# Patient Record
Sex: Male | Born: 1935 | Race: White | Hispanic: No
Health system: Southern US, Community
[De-identification: ages and names within clinical notes are randomized; demographics above are authoritative.]

## PROBLEM LIST (undated history)

## (undated) DIAGNOSIS — N400 Enlarged prostate without lower urinary tract symptoms: Secondary | ICD-10-CM

## (undated) DIAGNOSIS — F0280 Dementia in other diseases classified elsewhere without behavioral disturbance: Secondary | ICD-10-CM

## (undated) HISTORY — PX: CHOLECYSTECTOMY: SHX55

## (undated) HISTORY — PX: TONSILLECTOMY: SUR1361

---

## 2008-08-29 ENCOUNTER — Encounter
Admission: RE | Admit: 2008-08-29 | Discharge: 2008-08-29 | Payer: Self-pay | Admitting: Physical Medicine and Rehabilitation

## 2010-12-30 IMAGING — CT CT L SPINE W/O CM
3 of 12 series · 10 of 33 positions shown, 11 images · non-contrast
Comparison: None

CT THORACIC SPINE

CLINICAL DATA: Back pain.  Evaluate disc protrusion and
compression fracture.

CT THORACIC AND LUMBAR SPINE WITHOUT CONTRAST
TECHNIQUE: Multidetector CT imaging of the thoracic and lumbar
spine was performed without contrast. Multiplanar CT image
reconstructions were also generated.

[Series 5: lspine · axial · 0.28mm/px · z∈[+1181,+1427]mm · 3 of 124 slices shown, 4 images]
[im 1/124  soft-tissue]
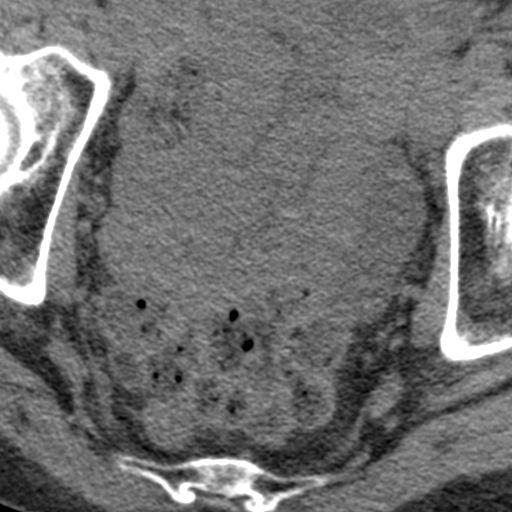
[im 1/124  bone]
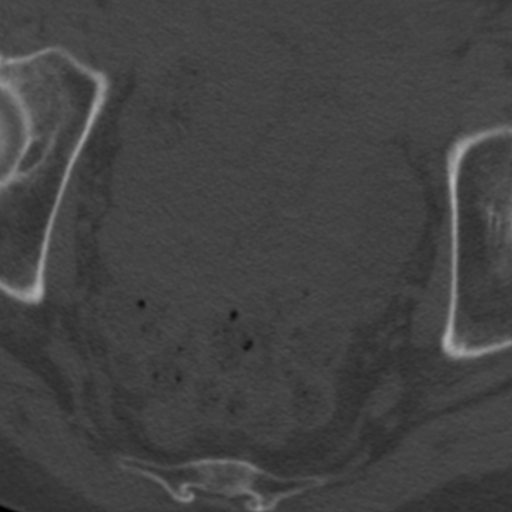
[im 62/124  bone]
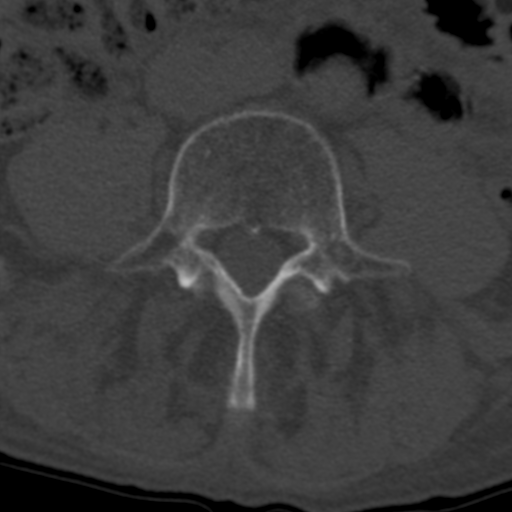
[im 124/124  bone]
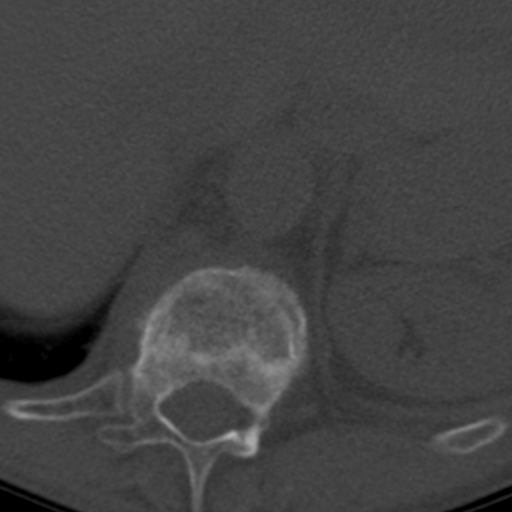

[coronals · coronal · 0.52mm/px · 2 of 92 slices shown]
[im 31/92  bone]
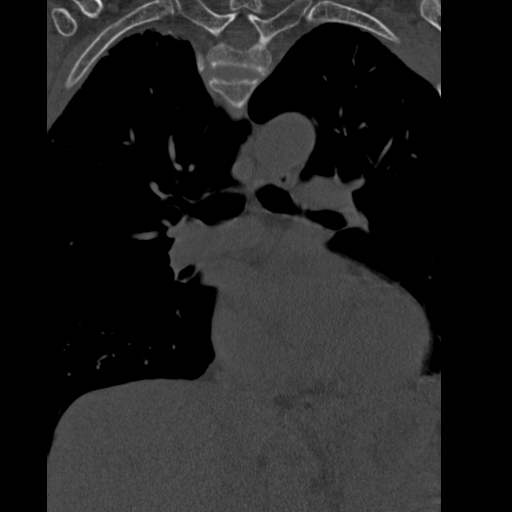
[im 61/92  bone]
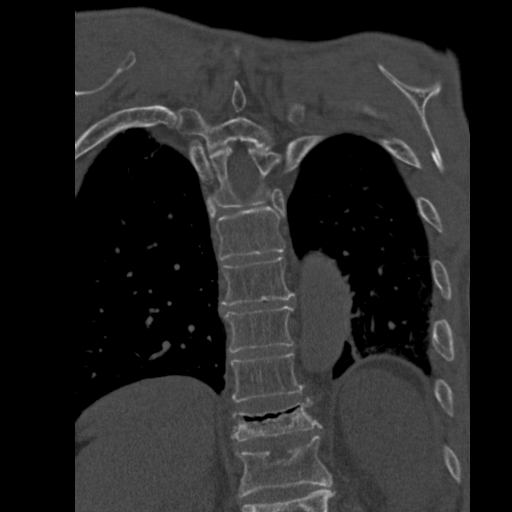

[sagittals · sagittal · 0.52mm/px · 5 of 89 slices shown]
[im 23/89  bone]
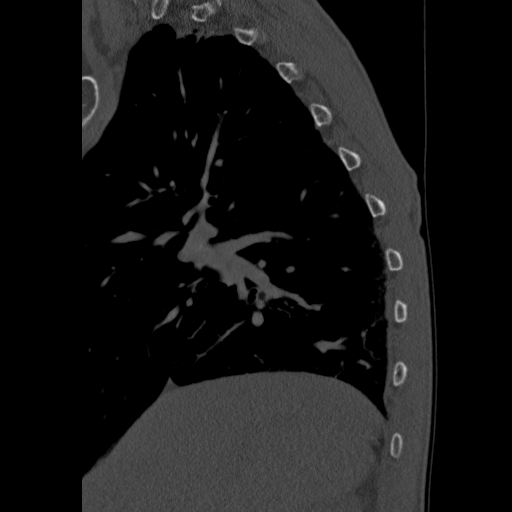
[im 34/89  bone]
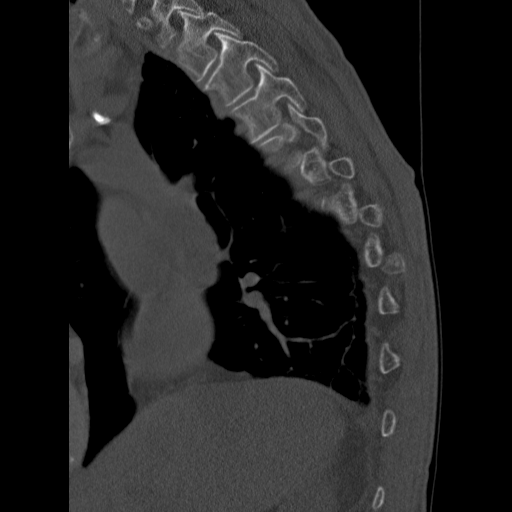
[im 45/89  bone]
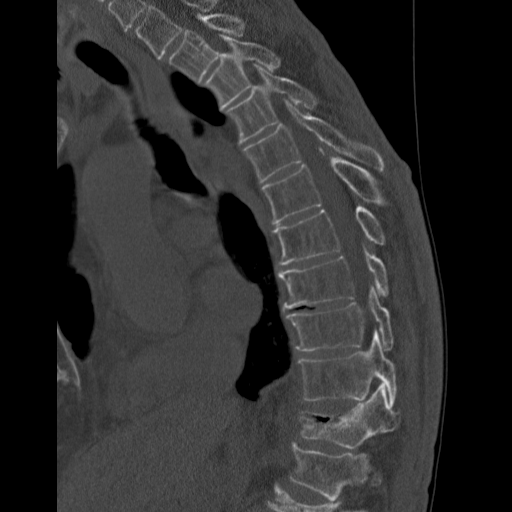
[im 56/89  bone]
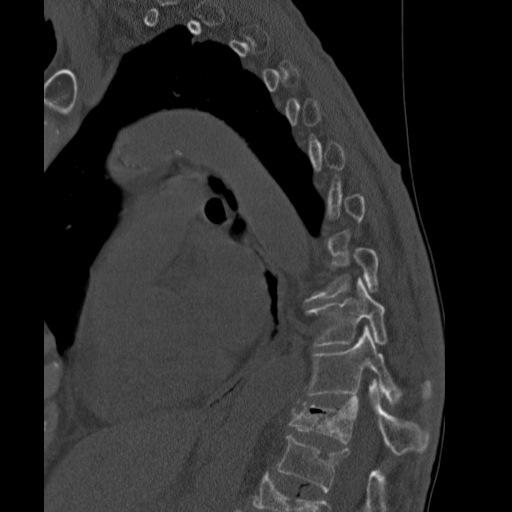
[im 67/89  bone]
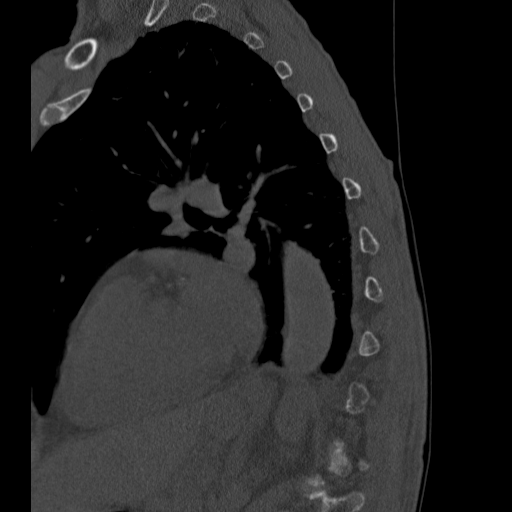

[10 of 33 positions shown; findings below may reference images not displayed]

FINDINGS: There is generalized osteopenia.  The thoracic alignment
is normal aside from a mild scoliosis.  A superior endplate
compression fracture at T11 results in 50% loss of vertebral body
height anteriorly.  There is vacuum phenomenon within the superior
endplate of T11 and the T10-T11 disc.  Mild paraspinal soft tissue
edema at that level suggests a probable acute or subacute age of
this fracture.  The posterior elements are intact.  There is no
significant epidural hematoma.

An approximately 25% superior endplate compression deformity at T12
appears simple and without evidence of associated paraspinal
hemorrhage.

No other thoracic spine fractures are identified.  The spinal canal
and neural foramina appear widely patent all levels.  No large disc
herniation is evident.

The visualized mediastinum is notable for coronary artery disease.
There is right apical pulmonary scarring.  No suspicious pulmonary
findings are present.
IMPRESSION: 1.  Approximately 50% superior endplate compression deformity at
T11.  There is associated paraspinal soft tissue stranding
suggesting an acute or subacute age.  Correlate clinically.
2.  Approximately 25% superior endplate compression deformity at
T12, probably chronic.
3.  No large disc herniation, spinal stenosis or nerve root
encroachment demonstrated.

CT LUMBAR SPINE
FINDINGS: There are biconcave compression fractures at L1 and L2.
Both fractures result in approximately 75% loss of vertebral body
height centrally.  Both fractures are associated with approximately
3 mm of osseous retropulsion of the superior endplate into the
spinal canal.  There are probable associated traumatic Schmorl's
nodes at L2.  No paraspinal or epidural hematoma is seen associated
with either of these fractures.  The L3 and L4 vertebral bodies are
intact.  There is a 50% biconcave compression fracture at L5
without osseous retropulsion or associated soft tissue swelling.  A
unilateral pars defect is noted on the left at L5.

T12-L1:  The disc bulging and osseous retropulsion contribute to
mild central stenosis.  There is no foraminal compromise.

L1-L2:  No significant disc space findings.

L2-L3:  Mild disc bulge and mild bilateral facet hypertrophy.  No
spinal stenosis or nerve root encroachment.

L3-L4:  Disc bulging and bilateral facet hypertrophy contribute to
mild triangulation of the thecal sac.  The foramina are
sufficiently patent.

L4-L5:  Disc bulging is mildly eccentric to the right and
contributes to mild central and mild right lateral recess stenosis.
Both foramina demonstrate mild inferior narrowing without definite
L4 nerve root encroachment.

L5-S1:  There is a broad-based central disc protrusion with
bilateral facet hypertrophy and a pars defect on the left.  Mild
neural foraminal narrowing is present bilaterally.
IMPRESSION: 1.  Age indeterminate compression fractures at L1, L2 and L5.  No
associated paraspinal hemorrhage or definite acute features are
demonstrated by CT.  MRI is more accurate for aging of spinal
compression fractures.
2.  The fractures at L1 and L2 are associated with mild
retropulsion of bone into the spinal canal, but no high-grade
spinal stenosis.
3.  Relatively mild spondylosis, most advanced at L4-L5 and L5-S1.
There is no high-grade spinal stenosis or nerve root compression.
Unilateral pars defect is noted on the left at L5.

## 2015-01-06 DIAGNOSIS — Z23 Encounter for immunization: Secondary | ICD-10-CM | POA: Diagnosis not present

## 2015-03-12 DIAGNOSIS — R69 Illness, unspecified: Secondary | ICD-10-CM | POA: Diagnosis not present

## 2015-06-22 DIAGNOSIS — S40912A Unspecified superficial injury of left shoulder, initial encounter: Secondary | ICD-10-CM | POA: Diagnosis not present

## 2015-06-30 DIAGNOSIS — S40912A Unspecified superficial injury of left shoulder, initial encounter: Secondary | ICD-10-CM | POA: Diagnosis not present

## 2015-07-28 DIAGNOSIS — H25813 Combined forms of age-related cataract, bilateral: Secondary | ICD-10-CM | POA: Diagnosis not present

## 2015-07-28 DIAGNOSIS — H401131 Primary open-angle glaucoma, bilateral, mild stage: Secondary | ICD-10-CM | POA: Diagnosis not present

## 2015-07-28 DIAGNOSIS — H4052X3 Glaucoma secondary to other eye disorders, left eye, severe stage: Secondary | ICD-10-CM | POA: Diagnosis not present

## 2015-08-03 DIAGNOSIS — Z23 Encounter for immunization: Secondary | ICD-10-CM | POA: Diagnosis not present

## 2015-09-10 DIAGNOSIS — R69 Illness, unspecified: Secondary | ICD-10-CM | POA: Diagnosis not present

## 2015-09-21 DIAGNOSIS — R52 Pain, unspecified: Secondary | ICD-10-CM | POA: Diagnosis not present

## 2015-09-21 DIAGNOSIS — S93601A Unspecified sprain of right foot, initial encounter: Secondary | ICD-10-CM | POA: Diagnosis not present

## 2015-12-11 DIAGNOSIS — I10 Essential (primary) hypertension: Secondary | ICD-10-CM | POA: Diagnosis not present

## 2015-12-11 DIAGNOSIS — Z0000 Encounter for general adult medical examination without abnormal findings: Secondary | ICD-10-CM | POA: Diagnosis not present

## 2015-12-11 DIAGNOSIS — Z125 Encounter for screening for malignant neoplasm of prostate: Secondary | ICD-10-CM | POA: Diagnosis not present

## 2015-12-11 DIAGNOSIS — E785 Hyperlipidemia, unspecified: Secondary | ICD-10-CM | POA: Diagnosis not present

## 2015-12-11 DIAGNOSIS — Z1389 Encounter for screening for other disorder: Secondary | ICD-10-CM | POA: Diagnosis not present

## 2016-01-05 DIAGNOSIS — R69 Illness, unspecified: Secondary | ICD-10-CM | POA: Diagnosis not present

## 2016-01-16 DIAGNOSIS — R6889 Other general symptoms and signs: Secondary | ICD-10-CM | POA: Diagnosis not present

## 2016-01-16 DIAGNOSIS — R4182 Altered mental status, unspecified: Secondary | ICD-10-CM | POA: Diagnosis not present

## 2016-01-16 DIAGNOSIS — I10 Essential (primary) hypertension: Secondary | ICD-10-CM | POA: Diagnosis not present

## 2016-01-16 DIAGNOSIS — E559 Vitamin D deficiency, unspecified: Secondary | ICD-10-CM | POA: Diagnosis not present

## 2016-01-16 DIAGNOSIS — R55 Syncope and collapse: Secondary | ICD-10-CM | POA: Diagnosis not present

## 2016-01-16 DIAGNOSIS — R69 Illness, unspecified: Secondary | ICD-10-CM | POA: Diagnosis not present

## 2016-01-16 DIAGNOSIS — R0602 Shortness of breath: Secondary | ICD-10-CM | POA: Diagnosis not present

## 2016-01-16 DIAGNOSIS — I680 Cerebral amyloid angiopathy: Secondary | ICD-10-CM | POA: Diagnosis not present

## 2016-01-16 DIAGNOSIS — H409 Unspecified glaucoma: Secondary | ICD-10-CM

## 2016-01-16 DIAGNOSIS — D649 Anemia, unspecified: Secondary | ICD-10-CM | POA: Diagnosis not present

## 2016-01-16 DIAGNOSIS — R001 Bradycardia, unspecified: Secondary | ICD-10-CM | POA: Diagnosis not present

## 2016-01-16 DIAGNOSIS — R5381 Other malaise: Secondary | ICD-10-CM

## 2016-01-16 DIAGNOSIS — S0990XA Unspecified injury of head, initial encounter: Secondary | ICD-10-CM | POA: Diagnosis not present

## 2016-01-16 DIAGNOSIS — Z79899 Other long term (current) drug therapy: Secondary | ICD-10-CM | POA: Diagnosis not present

## 2016-01-16 DIAGNOSIS — F0390 Unspecified dementia without behavioral disturbance: Secondary | ICD-10-CM | POA: Diagnosis not present

## 2016-01-16 DIAGNOSIS — E854 Organ-limited amyloidosis: Secondary | ICD-10-CM | POA: Diagnosis not present

## 2016-01-16 DIAGNOSIS — E860 Dehydration: Secondary | ICD-10-CM | POA: Diagnosis not present

## 2016-01-16 DIAGNOSIS — D181 Lymphangioma, any site: Secondary | ICD-10-CM | POA: Diagnosis not present

## 2016-01-17 DIAGNOSIS — D649 Anemia, unspecified: Secondary | ICD-10-CM | POA: Diagnosis not present

## 2016-01-17 DIAGNOSIS — R55 Syncope and collapse: Secondary | ICD-10-CM | POA: Diagnosis not present

## 2016-01-17 DIAGNOSIS — R69 Illness, unspecified: Secondary | ICD-10-CM | POA: Diagnosis not present

## 2016-01-17 DIAGNOSIS — I10 Essential (primary) hypertension: Secondary | ICD-10-CM | POA: Diagnosis not present

## 2016-01-17 DIAGNOSIS — E854 Organ-limited amyloidosis: Secondary | ICD-10-CM | POA: Diagnosis not present

## 2016-01-17 DIAGNOSIS — F0390 Unspecified dementia without behavioral disturbance: Secondary | ICD-10-CM | POA: Diagnosis not present

## 2016-01-18 DIAGNOSIS — E854 Organ-limited amyloidosis: Secondary | ICD-10-CM | POA: Diagnosis not present

## 2016-01-18 DIAGNOSIS — R55 Syncope and collapse: Secondary | ICD-10-CM | POA: Diagnosis not present

## 2016-01-18 DIAGNOSIS — R69 Illness, unspecified: Secondary | ICD-10-CM | POA: Diagnosis not present

## 2016-01-18 DIAGNOSIS — I10 Essential (primary) hypertension: Secondary | ICD-10-CM | POA: Diagnosis not present

## 2016-01-18 DIAGNOSIS — D649 Anemia, unspecified: Secondary | ICD-10-CM | POA: Diagnosis not present

## 2016-01-18 DIAGNOSIS — F0390 Unspecified dementia without behavioral disturbance: Secondary | ICD-10-CM | POA: Diagnosis not present

## 2016-01-20 DIAGNOSIS — I10 Essential (primary) hypertension: Secondary | ICD-10-CM | POA: Diagnosis not present

## 2016-01-20 DIAGNOSIS — H4052X3 Glaucoma secondary to other eye disorders, left eye, severe stage: Secondary | ICD-10-CM | POA: Diagnosis not present

## 2016-01-20 DIAGNOSIS — R4189 Other symptoms and signs involving cognitive functions and awareness: Secondary | ICD-10-CM | POA: Diagnosis not present

## 2016-01-20 DIAGNOSIS — R269 Unspecified abnormalities of gait and mobility: Secondary | ICD-10-CM | POA: Diagnosis not present

## 2016-01-25 DIAGNOSIS — R4189 Other symptoms and signs involving cognitive functions and awareness: Secondary | ICD-10-CM | POA: Diagnosis not present

## 2016-01-25 DIAGNOSIS — I680 Cerebral amyloid angiopathy: Secondary | ICD-10-CM | POA: Diagnosis not present

## 2016-01-25 DIAGNOSIS — R269 Unspecified abnormalities of gait and mobility: Secondary | ICD-10-CM | POA: Diagnosis not present

## 2016-02-24 DIAGNOSIS — R55 Syncope and collapse: Secondary | ICD-10-CM | POA: Diagnosis not present

## 2016-02-24 DIAGNOSIS — I680 Cerebral amyloid angiopathy: Secondary | ICD-10-CM | POA: Diagnosis not present

## 2016-02-29 DIAGNOSIS — J40 Bronchitis, not specified as acute or chronic: Secondary | ICD-10-CM | POA: Diagnosis not present

## 2016-03-18 DIAGNOSIS — R69 Illness, unspecified: Secondary | ICD-10-CM | POA: Diagnosis not present

## 2016-04-06 DIAGNOSIS — J189 Pneumonia, unspecified organism: Secondary | ICD-10-CM | POA: Diagnosis not present

## 2016-04-08 DIAGNOSIS — S0990XA Unspecified injury of head, initial encounter: Secondary | ICD-10-CM | POA: Diagnosis not present

## 2016-04-08 DIAGNOSIS — R531 Weakness: Secondary | ICD-10-CM | POA: Diagnosis not present

## 2016-04-08 DIAGNOSIS — R079 Chest pain, unspecified: Secondary | ICD-10-CM | POA: Diagnosis not present

## 2016-04-08 DIAGNOSIS — I6789 Other cerebrovascular disease: Secondary | ICD-10-CM | POA: Diagnosis not present

## 2016-04-08 DIAGNOSIS — R55 Syncope and collapse: Secondary | ICD-10-CM | POA: Diagnosis not present

## 2016-04-13 DIAGNOSIS — J189 Pneumonia, unspecified organism: Secondary | ICD-10-CM | POA: Diagnosis not present

## 2016-04-15 DIAGNOSIS — N390 Urinary tract infection, site not specified: Secondary | ICD-10-CM | POA: Diagnosis not present

## 2016-04-15 DIAGNOSIS — R3121 Asymptomatic microscopic hematuria: Secondary | ICD-10-CM | POA: Diagnosis not present

## 2016-04-15 DIAGNOSIS — R350 Frequency of micturition: Secondary | ICD-10-CM | POA: Diagnosis not present

## 2016-04-19 DIAGNOSIS — Z79899 Other long term (current) drug therapy: Secondary | ICD-10-CM | POA: Diagnosis not present

## 2016-04-19 DIAGNOSIS — R339 Retention of urine, unspecified: Secondary | ICD-10-CM | POA: Diagnosis not present

## 2016-04-19 DIAGNOSIS — R0602 Shortness of breath: Secondary | ICD-10-CM | POA: Diagnosis not present

## 2016-04-19 DIAGNOSIS — M25473 Effusion, unspecified ankle: Secondary | ICD-10-CM | POA: Diagnosis not present

## 2016-04-19 DIAGNOSIS — R55 Syncope and collapse: Secondary | ICD-10-CM | POA: Diagnosis not present

## 2016-04-22 DIAGNOSIS — R7989 Other specified abnormal findings of blood chemistry: Secondary | ICD-10-CM | POA: Diagnosis not present

## 2016-04-22 DIAGNOSIS — I82403 Acute embolism and thrombosis of unspecified deep veins of lower extremity, bilateral: Secondary | ICD-10-CM | POA: Diagnosis not present

## 2016-04-22 DIAGNOSIS — M7989 Other specified soft tissue disorders: Secondary | ICD-10-CM | POA: Diagnosis not present

## 2016-04-25 DIAGNOSIS — N401 Enlarged prostate with lower urinary tract symptoms: Secondary | ICD-10-CM | POA: Diagnosis not present

## 2016-04-25 DIAGNOSIS — R339 Retention of urine, unspecified: Secondary | ICD-10-CM | POA: Diagnosis not present

## 2016-05-03 DIAGNOSIS — R55 Syncope and collapse: Secondary | ICD-10-CM | POA: Diagnosis not present

## 2016-05-03 DIAGNOSIS — M7989 Other specified soft tissue disorders: Secondary | ICD-10-CM | POA: Diagnosis not present

## 2016-05-03 DIAGNOSIS — I10 Essential (primary) hypertension: Secondary | ICD-10-CM | POA: Diagnosis not present

## 2016-05-03 DIAGNOSIS — R7989 Other specified abnormal findings of blood chemistry: Secondary | ICD-10-CM | POA: Diagnosis not present

## 2016-05-03 DIAGNOSIS — N401 Enlarged prostate with lower urinary tract symptoms: Secondary | ICD-10-CM | POA: Diagnosis not present

## 2016-05-03 DIAGNOSIS — R3121 Asymptomatic microscopic hematuria: Secondary | ICD-10-CM | POA: Diagnosis not present

## 2016-05-09 DIAGNOSIS — M549 Dorsalgia, unspecified: Secondary | ICD-10-CM | POA: Diagnosis not present

## 2016-05-10 DIAGNOSIS — R55 Syncope and collapse: Secondary | ICD-10-CM | POA: Diagnosis not present

## 2016-05-11 DIAGNOSIS — N401 Enlarged prostate with lower urinary tract symptoms: Secondary | ICD-10-CM | POA: Diagnosis not present

## 2016-05-11 DIAGNOSIS — R339 Retention of urine, unspecified: Secondary | ICD-10-CM | POA: Diagnosis not present

## 2016-05-11 DIAGNOSIS — R55 Syncope and collapse: Secondary | ICD-10-CM | POA: Diagnosis not present

## 2016-05-16 DIAGNOSIS — R55 Syncope and collapse: Secondary | ICD-10-CM | POA: Diagnosis not present

## 2016-05-16 DIAGNOSIS — I10 Essential (primary) hypertension: Secondary | ICD-10-CM | POA: Diagnosis not present

## 2016-05-16 DIAGNOSIS — N400 Enlarged prostate without lower urinary tract symptoms: Secondary | ICD-10-CM | POA: Diagnosis not present

## 2016-05-20 DIAGNOSIS — I358 Other nonrheumatic aortic valve disorders: Secondary | ICD-10-CM | POA: Diagnosis not present

## 2016-05-20 DIAGNOSIS — R55 Syncope and collapse: Secondary | ICD-10-CM | POA: Diagnosis not present

## 2016-05-20 DIAGNOSIS — I10 Essential (primary) hypertension: Secondary | ICD-10-CM | POA: Diagnosis not present

## 2016-05-20 DIAGNOSIS — M7989 Other specified soft tissue disorders: Secondary | ICD-10-CM | POA: Diagnosis not present

## 2016-06-14 DIAGNOSIS — M7989 Other specified soft tissue disorders: Secondary | ICD-10-CM | POA: Diagnosis not present

## 2016-06-14 DIAGNOSIS — R4189 Other symptoms and signs involving cognitive functions and awareness: Secondary | ICD-10-CM | POA: Diagnosis not present

## 2016-06-14 DIAGNOSIS — I10 Essential (primary) hypertension: Secondary | ICD-10-CM | POA: Diagnosis not present

## 2016-06-14 DIAGNOSIS — N401 Enlarged prostate with lower urinary tract symptoms: Secondary | ICD-10-CM | POA: Diagnosis not present

## 2016-06-14 DIAGNOSIS — I680 Cerebral amyloid angiopathy: Secondary | ICD-10-CM | POA: Diagnosis not present

## 2016-06-28 DIAGNOSIS — R55 Syncope and collapse: Secondary | ICD-10-CM | POA: Diagnosis not present

## 2016-06-28 DIAGNOSIS — I10 Essential (primary) hypertension: Secondary | ICD-10-CM | POA: Diagnosis not present

## 2016-07-06 DIAGNOSIS — R339 Retention of urine, unspecified: Secondary | ICD-10-CM | POA: Diagnosis not present

## 2016-07-06 DIAGNOSIS — N401 Enlarged prostate with lower urinary tract symptoms: Secondary | ICD-10-CM | POA: Diagnosis not present

## 2016-08-04 DIAGNOSIS — H401131 Primary open-angle glaucoma, bilateral, mild stage: Secondary | ICD-10-CM | POA: Diagnosis not present

## 2016-08-04 DIAGNOSIS — H4052X3 Glaucoma secondary to other eye disorders, left eye, severe stage: Secondary | ICD-10-CM | POA: Diagnosis not present

## 2016-09-15 DIAGNOSIS — D649 Anemia, unspecified: Secondary | ICD-10-CM | POA: Diagnosis not present

## 2016-09-15 DIAGNOSIS — I10 Essential (primary) hypertension: Secondary | ICD-10-CM | POA: Diagnosis not present

## 2016-09-15 DIAGNOSIS — R4189 Other symptoms and signs involving cognitive functions and awareness: Secondary | ICD-10-CM | POA: Diagnosis not present

## 2016-09-15 DIAGNOSIS — Z79899 Other long term (current) drug therapy: Secondary | ICD-10-CM | POA: Diagnosis not present

## 2016-09-15 DIAGNOSIS — R05 Cough: Secondary | ICD-10-CM | POA: Diagnosis not present

## 2016-10-11 DIAGNOSIS — R339 Retention of urine, unspecified: Secondary | ICD-10-CM | POA: Diagnosis not present

## 2016-10-11 DIAGNOSIS — N401 Enlarged prostate with lower urinary tract symptoms: Secondary | ICD-10-CM | POA: Diagnosis not present

## 2016-12-06 DIAGNOSIS — R69 Illness, unspecified: Secondary | ICD-10-CM | POA: Diagnosis not present

## 2016-12-13 DIAGNOSIS — Z79899 Other long term (current) drug therapy: Secondary | ICD-10-CM | POA: Diagnosis not present

## 2016-12-13 DIAGNOSIS — E785 Hyperlipidemia, unspecified: Secondary | ICD-10-CM | POA: Diagnosis not present

## 2016-12-13 DIAGNOSIS — Z0001 Encounter for general adult medical examination with abnormal findings: Secondary | ICD-10-CM | POA: Diagnosis not present

## 2016-12-13 DIAGNOSIS — Z1389 Encounter for screening for other disorder: Secondary | ICD-10-CM | POA: Diagnosis not present

## 2017-02-01 DIAGNOSIS — R05 Cough: Secondary | ICD-10-CM | POA: Diagnosis not present

## 2017-02-21 DIAGNOSIS — R339 Retention of urine, unspecified: Secondary | ICD-10-CM | POA: Diagnosis not present

## 2017-02-21 DIAGNOSIS — N401 Enlarged prostate with lower urinary tract symptoms: Secondary | ICD-10-CM | POA: Diagnosis not present

## 2017-04-11 DIAGNOSIS — I10 Essential (primary) hypertension: Secondary | ICD-10-CM | POA: Diagnosis not present

## 2017-04-11 DIAGNOSIS — I680 Cerebral amyloid angiopathy: Secondary | ICD-10-CM | POA: Diagnosis not present

## 2017-04-11 DIAGNOSIS — R1310 Dysphagia, unspecified: Secondary | ICD-10-CM | POA: Diagnosis not present

## 2017-04-11 DIAGNOSIS — R4189 Other symptoms and signs involving cognitive functions and awareness: Secondary | ICD-10-CM | POA: Diagnosis not present

## 2017-05-12 DIAGNOSIS — Z79899 Other long term (current) drug therapy: Secondary | ICD-10-CM | POA: Diagnosis not present

## 2017-05-12 DIAGNOSIS — R413 Other amnesia: Secondary | ICD-10-CM | POA: Diagnosis not present

## 2017-05-12 DIAGNOSIS — R410 Disorientation, unspecified: Secondary | ICD-10-CM | POA: Diagnosis not present

## 2017-05-12 DIAGNOSIS — R4189 Other symptoms and signs involving cognitive functions and awareness: Secondary | ICD-10-CM | POA: Diagnosis not present

## 2017-05-12 DIAGNOSIS — G319 Degenerative disease of nervous system, unspecified: Secondary | ICD-10-CM | POA: Diagnosis not present

## 2017-05-12 DIAGNOSIS — I680 Cerebral amyloid angiopathy: Secondary | ICD-10-CM | POA: Diagnosis not present

## 2017-06-13 DIAGNOSIS — R69 Illness, unspecified: Secondary | ICD-10-CM | POA: Diagnosis not present

## 2017-08-07 DIAGNOSIS — H25813 Combined forms of age-related cataract, bilateral: Secondary | ICD-10-CM | POA: Diagnosis not present

## 2017-08-07 DIAGNOSIS — H401112 Primary open-angle glaucoma, right eye, moderate stage: Secondary | ICD-10-CM | POA: Diagnosis not present

## 2017-08-07 DIAGNOSIS — H401123 Primary open-angle glaucoma, left eye, severe stage: Secondary | ICD-10-CM | POA: Diagnosis not present

## 2017-08-07 DIAGNOSIS — H353131 Nonexudative age-related macular degeneration, bilateral, early dry stage: Secondary | ICD-10-CM | POA: Diagnosis not present

## 2017-10-09 DIAGNOSIS — I10 Essential (primary) hypertension: Secondary | ICD-10-CM | POA: Diagnosis not present

## 2017-10-09 DIAGNOSIS — Z79899 Other long term (current) drug therapy: Secondary | ICD-10-CM | POA: Diagnosis not present

## 2017-10-09 DIAGNOSIS — R5383 Other fatigue: Secondary | ICD-10-CM | POA: Diagnosis not present

## 2017-10-09 DIAGNOSIS — R4189 Other symptoms and signs involving cognitive functions and awareness: Secondary | ICD-10-CM | POA: Diagnosis not present

## 2017-10-09 DIAGNOSIS — R269 Unspecified abnormalities of gait and mobility: Secondary | ICD-10-CM | POA: Diagnosis not present

## 2017-12-23 DIAGNOSIS — R112 Nausea with vomiting, unspecified: Secondary | ICD-10-CM | POA: Diagnosis not present

## 2017-12-23 DIAGNOSIS — G4700 Insomnia, unspecified: Secondary | ICD-10-CM | POA: Diagnosis not present

## 2017-12-23 DIAGNOSIS — R5381 Other malaise: Secondary | ICD-10-CM | POA: Diagnosis not present

## 2017-12-23 DIAGNOSIS — R4182 Altered mental status, unspecified: Secondary | ICD-10-CM | POA: Diagnosis not present

## 2017-12-23 DIAGNOSIS — R0902 Hypoxemia: Secondary | ICD-10-CM | POA: Diagnosis not present

## 2017-12-23 DIAGNOSIS — Z9181 History of falling: Secondary | ICD-10-CM | POA: Diagnosis not present

## 2017-12-23 DIAGNOSIS — N179 Acute kidney failure, unspecified: Secondary | ICD-10-CM | POA: Diagnosis not present

## 2017-12-23 DIAGNOSIS — R69 Illness, unspecified: Secondary | ICD-10-CM | POA: Diagnosis not present

## 2017-12-23 DIAGNOSIS — Z23 Encounter for immunization: Secondary | ICD-10-CM | POA: Diagnosis not present

## 2017-12-23 DIAGNOSIS — Z741 Need for assistance with personal care: Secondary | ICD-10-CM | POA: Diagnosis not present

## 2017-12-23 DIAGNOSIS — E860 Dehydration: Secondary | ICD-10-CM | POA: Diagnosis not present

## 2017-12-23 DIAGNOSIS — I10 Essential (primary) hypertension: Secondary | ICD-10-CM | POA: Diagnosis not present

## 2017-12-23 DIAGNOSIS — R262 Difficulty in walking, not elsewhere classified: Secondary | ICD-10-CM | POA: Diagnosis not present

## 2017-12-23 DIAGNOSIS — K219 Gastro-esophageal reflux disease without esophagitis: Secondary | ICD-10-CM | POA: Diagnosis not present

## 2017-12-23 DIAGNOSIS — R270 Ataxia, unspecified: Secondary | ICD-10-CM | POA: Diagnosis not present

## 2017-12-23 DIAGNOSIS — R531 Weakness: Secondary | ICD-10-CM | POA: Diagnosis not present

## 2017-12-23 DIAGNOSIS — R42 Dizziness and giddiness: Secondary | ICD-10-CM | POA: Diagnosis not present

## 2017-12-23 DIAGNOSIS — J9811 Atelectasis: Secondary | ICD-10-CM | POA: Diagnosis not present

## 2017-12-23 DIAGNOSIS — Z7401 Bed confinement status: Secondary | ICD-10-CM | POA: Diagnosis not present

## 2017-12-23 DIAGNOSIS — F0390 Unspecified dementia without behavioral disturbance: Secondary | ICD-10-CM

## 2017-12-23 DIAGNOSIS — R410 Disorientation, unspecified: Secondary | ICD-10-CM | POA: Diagnosis not present

## 2017-12-23 DIAGNOSIS — Z79899 Other long term (current) drug therapy: Secondary | ICD-10-CM | POA: Diagnosis not present

## 2017-12-25 DIAGNOSIS — R5381 Other malaise: Secondary | ICD-10-CM

## 2017-12-25 DIAGNOSIS — E860 Dehydration: Secondary | ICD-10-CM

## 2017-12-26 DIAGNOSIS — N183 Chronic kidney disease, stage 3 (moderate): Secondary | ICD-10-CM | POA: Diagnosis not present

## 2017-12-26 DIAGNOSIS — G4700 Insomnia, unspecified: Secondary | ICD-10-CM | POA: Diagnosis not present

## 2017-12-26 DIAGNOSIS — Z741 Need for assistance with personal care: Secondary | ICD-10-CM | POA: Diagnosis not present

## 2017-12-26 DIAGNOSIS — I10 Essential (primary) hypertension: Secondary | ICD-10-CM | POA: Diagnosis not present

## 2017-12-26 DIAGNOSIS — N179 Acute kidney failure, unspecified: Secondary | ICD-10-CM | POA: Diagnosis not present

## 2017-12-26 DIAGNOSIS — Z7401 Bed confinement status: Secondary | ICD-10-CM | POA: Diagnosis not present

## 2017-12-26 DIAGNOSIS — E441 Mild protein-calorie malnutrition: Secondary | ICD-10-CM | POA: Diagnosis not present

## 2017-12-26 DIAGNOSIS — I2699 Other pulmonary embolism without acute cor pulmonale: Secondary | ICD-10-CM | POA: Diagnosis not present

## 2017-12-26 DIAGNOSIS — Z23 Encounter for immunization: Secondary | ICD-10-CM | POA: Diagnosis not present

## 2017-12-26 DIAGNOSIS — I680 Cerebral amyloid angiopathy: Secondary | ICD-10-CM | POA: Diagnosis not present

## 2017-12-26 DIAGNOSIS — Z79899 Other long term (current) drug therapy: Secondary | ICD-10-CM | POA: Diagnosis not present

## 2017-12-26 DIAGNOSIS — K219 Gastro-esophageal reflux disease without esophagitis: Secondary | ICD-10-CM | POA: Diagnosis not present

## 2017-12-26 DIAGNOSIS — Z9181 History of falling: Secondary | ICD-10-CM | POA: Diagnosis not present

## 2017-12-26 DIAGNOSIS — D649 Anemia, unspecified: Secondary | ICD-10-CM | POA: Diagnosis not present

## 2017-12-26 DIAGNOSIS — R270 Ataxia, unspecified: Secondary | ICD-10-CM | POA: Diagnosis not present

## 2017-12-26 DIAGNOSIS — E860 Dehydration: Secondary | ICD-10-CM | POA: Diagnosis not present

## 2017-12-26 DIAGNOSIS — R69 Illness, unspecified: Secondary | ICD-10-CM | POA: Diagnosis not present

## 2017-12-26 DIAGNOSIS — R5381 Other malaise: Secondary | ICD-10-CM | POA: Diagnosis not present

## 2017-12-26 DIAGNOSIS — R42 Dizziness and giddiness: Secondary | ICD-10-CM | POA: Diagnosis not present

## 2017-12-26 DIAGNOSIS — S42002D Fracture of unspecified part of left clavicle, subsequent encounter for fracture with routine healing: Secondary | ICD-10-CM | POA: Diagnosis not present

## 2017-12-26 DIAGNOSIS — R0902 Hypoxemia: Secondary | ICD-10-CM | POA: Diagnosis not present

## 2017-12-26 DIAGNOSIS — R262 Difficulty in walking, not elsewhere classified: Secondary | ICD-10-CM | POA: Diagnosis not present

## 2017-12-26 DIAGNOSIS — H409 Unspecified glaucoma: Secondary | ICD-10-CM | POA: Diagnosis not present

## 2017-12-26 DIAGNOSIS — F0390 Unspecified dementia without behavioral disturbance: Secondary | ICD-10-CM | POA: Diagnosis not present

## 2017-12-26 DIAGNOSIS — E854 Organ-limited amyloidosis: Secondary | ICD-10-CM | POA: Diagnosis not present

## 2017-12-27 DIAGNOSIS — R69 Illness, unspecified: Secondary | ICD-10-CM | POA: Diagnosis not present

## 2017-12-27 DIAGNOSIS — N183 Chronic kidney disease, stage 3 (moderate): Secondary | ICD-10-CM | POA: Diagnosis not present

## 2017-12-27 DIAGNOSIS — E441 Mild protein-calorie malnutrition: Secondary | ICD-10-CM | POA: Diagnosis not present

## 2017-12-27 DIAGNOSIS — R262 Difficulty in walking, not elsewhere classified: Secondary | ICD-10-CM | POA: Diagnosis not present

## 2018-01-01 DIAGNOSIS — D649 Anemia, unspecified: Secondary | ICD-10-CM | POA: Diagnosis not present

## 2018-01-01 DIAGNOSIS — R69 Illness, unspecified: Secondary | ICD-10-CM | POA: Diagnosis not present

## 2018-01-01 DIAGNOSIS — R270 Ataxia, unspecified: Secondary | ICD-10-CM | POA: Diagnosis not present

## 2018-01-01 DIAGNOSIS — N179 Acute kidney failure, unspecified: Secondary | ICD-10-CM | POA: Diagnosis not present

## 2018-01-01 DIAGNOSIS — I10 Essential (primary) hypertension: Secondary | ICD-10-CM | POA: Diagnosis not present

## 2018-01-07 DIAGNOSIS — I680 Cerebral amyloid angiopathy: Secondary | ICD-10-CM | POA: Diagnosis not present

## 2018-01-07 DIAGNOSIS — E854 Organ-limited amyloidosis: Secondary | ICD-10-CM | POA: Diagnosis not present

## 2018-01-07 DIAGNOSIS — H409 Unspecified glaucoma: Secondary | ICD-10-CM | POA: Diagnosis not present

## 2018-01-07 DIAGNOSIS — R270 Ataxia, unspecified: Secondary | ICD-10-CM | POA: Diagnosis not present

## 2018-01-07 DIAGNOSIS — I2699 Other pulmonary embolism without acute cor pulmonale: Secondary | ICD-10-CM | POA: Diagnosis not present

## 2018-01-07 DIAGNOSIS — R42 Dizziness and giddiness: Secondary | ICD-10-CM | POA: Diagnosis not present

## 2018-01-07 DIAGNOSIS — S42002D Fracture of unspecified part of left clavicle, subsequent encounter for fracture with routine healing: Secondary | ICD-10-CM | POA: Diagnosis not present

## 2018-01-07 DIAGNOSIS — R69 Illness, unspecified: Secondary | ICD-10-CM | POA: Diagnosis not present

## 2018-01-07 DIAGNOSIS — I10 Essential (primary) hypertension: Secondary | ICD-10-CM | POA: Diagnosis not present

## 2018-01-07 DIAGNOSIS — D649 Anemia, unspecified: Secondary | ICD-10-CM | POA: Diagnosis not present

## 2018-01-09 DIAGNOSIS — I680 Cerebral amyloid angiopathy: Secondary | ICD-10-CM | POA: Diagnosis not present

## 2018-01-09 DIAGNOSIS — I2699 Other pulmonary embolism without acute cor pulmonale: Secondary | ICD-10-CM | POA: Diagnosis not present

## 2018-01-09 DIAGNOSIS — H409 Unspecified glaucoma: Secondary | ICD-10-CM | POA: Diagnosis not present

## 2018-01-09 DIAGNOSIS — I10 Essential (primary) hypertension: Secondary | ICD-10-CM | POA: Diagnosis not present

## 2018-01-09 DIAGNOSIS — R42 Dizziness and giddiness: Secondary | ICD-10-CM | POA: Diagnosis not present

## 2018-01-09 DIAGNOSIS — D649 Anemia, unspecified: Secondary | ICD-10-CM | POA: Diagnosis not present

## 2018-01-09 DIAGNOSIS — S42002D Fracture of unspecified part of left clavicle, subsequent encounter for fracture with routine healing: Secondary | ICD-10-CM | POA: Diagnosis not present

## 2018-01-09 DIAGNOSIS — E854 Organ-limited amyloidosis: Secondary | ICD-10-CM | POA: Diagnosis not present

## 2018-01-09 DIAGNOSIS — R69 Illness, unspecified: Secondary | ICD-10-CM | POA: Diagnosis not present

## 2018-01-09 DIAGNOSIS — R270 Ataxia, unspecified: Secondary | ICD-10-CM | POA: Diagnosis not present

## 2018-01-10 DIAGNOSIS — S42002D Fracture of unspecified part of left clavicle, subsequent encounter for fracture with routine healing: Secondary | ICD-10-CM | POA: Diagnosis not present

## 2018-01-10 DIAGNOSIS — D649 Anemia, unspecified: Secondary | ICD-10-CM | POA: Diagnosis not present

## 2018-01-10 DIAGNOSIS — R270 Ataxia, unspecified: Secondary | ICD-10-CM | POA: Diagnosis not present

## 2018-01-10 DIAGNOSIS — R69 Illness, unspecified: Secondary | ICD-10-CM | POA: Diagnosis not present

## 2018-01-10 DIAGNOSIS — H409 Unspecified glaucoma: Secondary | ICD-10-CM | POA: Diagnosis not present

## 2018-01-10 DIAGNOSIS — I680 Cerebral amyloid angiopathy: Secondary | ICD-10-CM | POA: Diagnosis not present

## 2018-01-10 DIAGNOSIS — R42 Dizziness and giddiness: Secondary | ICD-10-CM | POA: Diagnosis not present

## 2018-01-10 DIAGNOSIS — I2699 Other pulmonary embolism without acute cor pulmonale: Secondary | ICD-10-CM | POA: Diagnosis not present

## 2018-01-10 DIAGNOSIS — I10 Essential (primary) hypertension: Secondary | ICD-10-CM | POA: Diagnosis not present

## 2018-01-10 DIAGNOSIS — E854 Organ-limited amyloidosis: Secondary | ICD-10-CM | POA: Diagnosis not present

## 2018-01-11 DIAGNOSIS — Z6820 Body mass index (BMI) 20.0-20.9, adult: Secondary | ICD-10-CM | POA: Diagnosis not present

## 2018-01-11 DIAGNOSIS — E854 Organ-limited amyloidosis: Secondary | ICD-10-CM | POA: Diagnosis not present

## 2018-01-11 DIAGNOSIS — Z79899 Other long term (current) drug therapy: Secondary | ICD-10-CM | POA: Diagnosis not present

## 2018-01-11 DIAGNOSIS — R531 Weakness: Secondary | ICD-10-CM | POA: Diagnosis not present

## 2018-01-11 DIAGNOSIS — I680 Cerebral amyloid angiopathy: Secondary | ICD-10-CM | POA: Diagnosis not present

## 2018-01-11 DIAGNOSIS — R42 Dizziness and giddiness: Secondary | ICD-10-CM | POA: Diagnosis not present

## 2018-01-11 DIAGNOSIS — S42002D Fracture of unspecified part of left clavicle, subsequent encounter for fracture with routine healing: Secondary | ICD-10-CM | POA: Diagnosis not present

## 2018-01-11 DIAGNOSIS — R4189 Other symptoms and signs involving cognitive functions and awareness: Secondary | ICD-10-CM | POA: Diagnosis not present

## 2018-01-11 DIAGNOSIS — I10 Essential (primary) hypertension: Secondary | ICD-10-CM | POA: Diagnosis not present

## 2018-01-11 DIAGNOSIS — R69 Illness, unspecified: Secondary | ICD-10-CM | POA: Diagnosis not present

## 2018-01-11 DIAGNOSIS — R270 Ataxia, unspecified: Secondary | ICD-10-CM | POA: Diagnosis not present

## 2018-01-11 DIAGNOSIS — I2699 Other pulmonary embolism without acute cor pulmonale: Secondary | ICD-10-CM | POA: Diagnosis not present

## 2018-01-11 DIAGNOSIS — H409 Unspecified glaucoma: Secondary | ICD-10-CM | POA: Diagnosis not present

## 2018-01-11 DIAGNOSIS — D649 Anemia, unspecified: Secondary | ICD-10-CM | POA: Diagnosis not present

## 2018-01-12 DIAGNOSIS — X58XXXA Exposure to other specified factors, initial encounter: Secondary | ICD-10-CM | POA: Diagnosis not present

## 2018-01-12 DIAGNOSIS — R0902 Hypoxemia: Secondary | ICD-10-CM | POA: Diagnosis not present

## 2018-01-12 DIAGNOSIS — R69 Illness, unspecified: Secondary | ICD-10-CM | POA: Diagnosis not present

## 2018-01-12 DIAGNOSIS — R4182 Altered mental status, unspecified: Secondary | ICD-10-CM | POA: Diagnosis not present

## 2018-01-12 DIAGNOSIS — R42 Dizziness and giddiness: Secondary | ICD-10-CM | POA: Diagnosis not present

## 2018-01-12 DIAGNOSIS — E854 Organ-limited amyloidosis: Secondary | ICD-10-CM | POA: Diagnosis not present

## 2018-01-12 DIAGNOSIS — J9811 Atelectasis: Secondary | ICD-10-CM | POA: Diagnosis not present

## 2018-01-12 DIAGNOSIS — S42002D Fracture of unspecified part of left clavicle, subsequent encounter for fracture with routine healing: Secondary | ICD-10-CM | POA: Diagnosis not present

## 2018-01-12 DIAGNOSIS — F0390 Unspecified dementia without behavioral disturbance: Secondary | ICD-10-CM

## 2018-01-12 DIAGNOSIS — S42002A Fracture of unspecified part of left clavicle, initial encounter for closed fracture: Secondary | ICD-10-CM | POA: Diagnosis not present

## 2018-01-12 DIAGNOSIS — I348 Other nonrheumatic mitral valve disorders: Secondary | ICD-10-CM | POA: Diagnosis not present

## 2018-01-12 DIAGNOSIS — I2699 Other pulmonary embolism without acute cor pulmonale: Secondary | ICD-10-CM | POA: Diagnosis not present

## 2018-01-12 DIAGNOSIS — D649 Anemia, unspecified: Secondary | ICD-10-CM | POA: Diagnosis not present

## 2018-01-12 DIAGNOSIS — E860 Dehydration: Secondary | ICD-10-CM | POA: Diagnosis not present

## 2018-01-12 DIAGNOSIS — Z7401 Bed confinement status: Secondary | ICD-10-CM | POA: Diagnosis not present

## 2018-01-12 DIAGNOSIS — I680 Cerebral amyloid angiopathy: Secondary | ICD-10-CM | POA: Diagnosis not present

## 2018-01-12 DIAGNOSIS — R5381 Other malaise: Secondary | ICD-10-CM | POA: Diagnosis not present

## 2018-01-12 DIAGNOSIS — H409 Unspecified glaucoma: Secondary | ICD-10-CM | POA: Diagnosis not present

## 2018-01-12 DIAGNOSIS — R404 Transient alteration of awareness: Secondary | ICD-10-CM | POA: Diagnosis not present

## 2018-01-12 DIAGNOSIS — I2694 Multiple subsegmental pulmonary emboli without acute cor pulmonale: Secondary | ICD-10-CM | POA: Diagnosis not present

## 2018-01-12 DIAGNOSIS — I10 Essential (primary) hypertension: Secondary | ICD-10-CM | POA: Diagnosis not present

## 2018-01-12 DIAGNOSIS — I361 Nonrheumatic tricuspid (valve) insufficiency: Secondary | ICD-10-CM | POA: Diagnosis not present

## 2018-01-12 DIAGNOSIS — S0990XA Unspecified injury of head, initial encounter: Secondary | ICD-10-CM | POA: Diagnosis not present

## 2018-01-12 DIAGNOSIS — R270 Ataxia, unspecified: Secondary | ICD-10-CM | POA: Diagnosis not present

## 2018-01-13 DIAGNOSIS — Z7401 Bed confinement status: Secondary | ICD-10-CM | POA: Diagnosis not present

## 2018-01-13 DIAGNOSIS — R5381 Other malaise: Secondary | ICD-10-CM | POA: Diagnosis not present

## 2018-01-13 DIAGNOSIS — D649 Anemia, unspecified: Secondary | ICD-10-CM | POA: Diagnosis not present

## 2018-01-13 DIAGNOSIS — I10 Essential (primary) hypertension: Secondary | ICD-10-CM | POA: Diagnosis not present

## 2018-01-13 DIAGNOSIS — R69 Illness, unspecified: Secondary | ICD-10-CM | POA: Diagnosis not present

## 2018-01-13 DIAGNOSIS — I2699 Other pulmonary embolism without acute cor pulmonale: Secondary | ICD-10-CM | POA: Diagnosis not present

## 2018-01-13 DIAGNOSIS — S42002A Fracture of unspecified part of left clavicle, initial encounter for closed fracture: Secondary | ICD-10-CM | POA: Diagnosis not present

## 2018-01-13 DIAGNOSIS — E860 Dehydration: Secondary | ICD-10-CM | POA: Diagnosis not present

## 2018-01-13 DIAGNOSIS — E854 Organ-limited amyloidosis: Secondary | ICD-10-CM | POA: Diagnosis not present

## 2018-01-15 DIAGNOSIS — S42002D Fracture of unspecified part of left clavicle, subsequent encounter for fracture with routine healing: Secondary | ICD-10-CM | POA: Diagnosis not present

## 2018-01-15 DIAGNOSIS — R42 Dizziness and giddiness: Secondary | ICD-10-CM | POA: Diagnosis not present

## 2018-01-15 DIAGNOSIS — D649 Anemia, unspecified: Secondary | ICD-10-CM | POA: Diagnosis not present

## 2018-01-15 DIAGNOSIS — E854 Organ-limited amyloidosis: Secondary | ICD-10-CM | POA: Diagnosis not present

## 2018-01-15 DIAGNOSIS — I2699 Other pulmonary embolism without acute cor pulmonale: Secondary | ICD-10-CM | POA: Diagnosis not present

## 2018-01-15 DIAGNOSIS — R69 Illness, unspecified: Secondary | ICD-10-CM | POA: Diagnosis not present

## 2018-01-15 DIAGNOSIS — I680 Cerebral amyloid angiopathy: Secondary | ICD-10-CM | POA: Diagnosis not present

## 2018-01-15 DIAGNOSIS — R270 Ataxia, unspecified: Secondary | ICD-10-CM | POA: Diagnosis not present

## 2018-01-15 DIAGNOSIS — H409 Unspecified glaucoma: Secondary | ICD-10-CM | POA: Diagnosis not present

## 2018-01-15 DIAGNOSIS — I10 Essential (primary) hypertension: Secondary | ICD-10-CM | POA: Diagnosis not present

## 2018-01-16 DIAGNOSIS — I680 Cerebral amyloid angiopathy: Secondary | ICD-10-CM | POA: Diagnosis not present

## 2018-01-16 DIAGNOSIS — D649 Anemia, unspecified: Secondary | ICD-10-CM | POA: Diagnosis not present

## 2018-01-16 DIAGNOSIS — I10 Essential (primary) hypertension: Secondary | ICD-10-CM | POA: Diagnosis not present

## 2018-01-16 DIAGNOSIS — R69 Illness, unspecified: Secondary | ICD-10-CM | POA: Diagnosis not present

## 2018-01-16 DIAGNOSIS — R270 Ataxia, unspecified: Secondary | ICD-10-CM | POA: Diagnosis not present

## 2018-01-16 DIAGNOSIS — E854 Organ-limited amyloidosis: Secondary | ICD-10-CM | POA: Diagnosis not present

## 2018-01-16 DIAGNOSIS — H409 Unspecified glaucoma: Secondary | ICD-10-CM | POA: Diagnosis not present

## 2018-01-16 DIAGNOSIS — S42002D Fracture of unspecified part of left clavicle, subsequent encounter for fracture with routine healing: Secondary | ICD-10-CM | POA: Diagnosis not present

## 2018-01-16 DIAGNOSIS — R42 Dizziness and giddiness: Secondary | ICD-10-CM | POA: Diagnosis not present

## 2018-01-16 DIAGNOSIS — I2699 Other pulmonary embolism without acute cor pulmonale: Secondary | ICD-10-CM | POA: Diagnosis not present

## 2018-01-17 DIAGNOSIS — E854 Organ-limited amyloidosis: Secondary | ICD-10-CM | POA: Diagnosis not present

## 2018-01-17 DIAGNOSIS — D649 Anemia, unspecified: Secondary | ICD-10-CM | POA: Diagnosis not present

## 2018-01-17 DIAGNOSIS — R270 Ataxia, unspecified: Secondary | ICD-10-CM | POA: Diagnosis not present

## 2018-01-17 DIAGNOSIS — S42002D Fracture of unspecified part of left clavicle, subsequent encounter for fracture with routine healing: Secondary | ICD-10-CM | POA: Diagnosis not present

## 2018-01-17 DIAGNOSIS — H409 Unspecified glaucoma: Secondary | ICD-10-CM | POA: Diagnosis not present

## 2018-01-17 DIAGNOSIS — I2699 Other pulmonary embolism without acute cor pulmonale: Secondary | ICD-10-CM | POA: Diagnosis not present

## 2018-01-17 DIAGNOSIS — I680 Cerebral amyloid angiopathy: Secondary | ICD-10-CM | POA: Diagnosis not present

## 2018-01-17 DIAGNOSIS — R69 Illness, unspecified: Secondary | ICD-10-CM | POA: Diagnosis not present

## 2018-01-17 DIAGNOSIS — I10 Essential (primary) hypertension: Secondary | ICD-10-CM | POA: Diagnosis not present

## 2018-01-17 DIAGNOSIS — R42 Dizziness and giddiness: Secondary | ICD-10-CM | POA: Diagnosis not present

## 2018-01-19 DIAGNOSIS — R42 Dizziness and giddiness: Secondary | ICD-10-CM | POA: Diagnosis not present

## 2018-01-19 DIAGNOSIS — N401 Enlarged prostate with lower urinary tract symptoms: Secondary | ICD-10-CM | POA: Diagnosis not present

## 2018-01-19 DIAGNOSIS — H409 Unspecified glaucoma: Secondary | ICD-10-CM | POA: Diagnosis not present

## 2018-01-19 DIAGNOSIS — R270 Ataxia, unspecified: Secondary | ICD-10-CM | POA: Diagnosis not present

## 2018-01-19 DIAGNOSIS — R69 Illness, unspecified: Secondary | ICD-10-CM | POA: Diagnosis not present

## 2018-01-19 DIAGNOSIS — N471 Phimosis: Secondary | ICD-10-CM | POA: Diagnosis not present

## 2018-01-19 DIAGNOSIS — R339 Retention of urine, unspecified: Secondary | ICD-10-CM | POA: Diagnosis not present

## 2018-01-19 DIAGNOSIS — I680 Cerebral amyloid angiopathy: Secondary | ICD-10-CM | POA: Diagnosis not present

## 2018-01-19 DIAGNOSIS — E854 Organ-limited amyloidosis: Secondary | ICD-10-CM | POA: Diagnosis not present

## 2018-01-19 DIAGNOSIS — I10 Essential (primary) hypertension: Secondary | ICD-10-CM | POA: Diagnosis not present

## 2018-01-19 DIAGNOSIS — N481 Balanitis: Secondary | ICD-10-CM | POA: Diagnosis not present

## 2018-01-19 DIAGNOSIS — D649 Anemia, unspecified: Secondary | ICD-10-CM | POA: Diagnosis not present

## 2018-01-19 DIAGNOSIS — S42002D Fracture of unspecified part of left clavicle, subsequent encounter for fracture with routine healing: Secondary | ICD-10-CM | POA: Diagnosis not present

## 2018-01-19 DIAGNOSIS — I2699 Other pulmonary embolism without acute cor pulmonale: Secondary | ICD-10-CM | POA: Diagnosis not present

## 2018-01-23 DIAGNOSIS — S42002A Fracture of unspecified part of left clavicle, initial encounter for closed fracture: Secondary | ICD-10-CM | POA: Diagnosis not present

## 2018-01-23 DIAGNOSIS — I2699 Other pulmonary embolism without acute cor pulmonale: Secondary | ICD-10-CM | POA: Diagnosis not present

## 2018-01-23 DIAGNOSIS — Z6820 Body mass index (BMI) 20.0-20.9, adult: Secondary | ICD-10-CM | POA: Diagnosis not present

## 2018-01-23 DIAGNOSIS — R69 Illness, unspecified: Secondary | ICD-10-CM | POA: Diagnosis not present

## 2018-01-23 DIAGNOSIS — N481 Balanitis: Secondary | ICD-10-CM | POA: Diagnosis not present

## 2018-01-24 DIAGNOSIS — D649 Anemia, unspecified: Secondary | ICD-10-CM | POA: Diagnosis not present

## 2018-01-24 DIAGNOSIS — H409 Unspecified glaucoma: Secondary | ICD-10-CM | POA: Diagnosis not present

## 2018-01-24 DIAGNOSIS — E854 Organ-limited amyloidosis: Secondary | ICD-10-CM | POA: Diagnosis not present

## 2018-01-24 DIAGNOSIS — R270 Ataxia, unspecified: Secondary | ICD-10-CM | POA: Diagnosis not present

## 2018-01-24 DIAGNOSIS — R42 Dizziness and giddiness: Secondary | ICD-10-CM | POA: Diagnosis not present

## 2018-01-24 DIAGNOSIS — I10 Essential (primary) hypertension: Secondary | ICD-10-CM | POA: Diagnosis not present

## 2018-01-24 DIAGNOSIS — S42002D Fracture of unspecified part of left clavicle, subsequent encounter for fracture with routine healing: Secondary | ICD-10-CM | POA: Diagnosis not present

## 2018-01-24 DIAGNOSIS — I680 Cerebral amyloid angiopathy: Secondary | ICD-10-CM | POA: Diagnosis not present

## 2018-01-24 DIAGNOSIS — R69 Illness, unspecified: Secondary | ICD-10-CM | POA: Diagnosis not present

## 2018-01-24 DIAGNOSIS — I2699 Other pulmonary embolism without acute cor pulmonale: Secondary | ICD-10-CM | POA: Diagnosis not present

## 2018-01-25 DIAGNOSIS — H409 Unspecified glaucoma: Secondary | ICD-10-CM | POA: Diagnosis not present

## 2018-01-25 DIAGNOSIS — I2699 Other pulmonary embolism without acute cor pulmonale: Secondary | ICD-10-CM | POA: Diagnosis not present

## 2018-01-25 DIAGNOSIS — I680 Cerebral amyloid angiopathy: Secondary | ICD-10-CM | POA: Diagnosis not present

## 2018-01-25 DIAGNOSIS — I10 Essential (primary) hypertension: Secondary | ICD-10-CM | POA: Diagnosis not present

## 2018-01-25 DIAGNOSIS — D649 Anemia, unspecified: Secondary | ICD-10-CM | POA: Diagnosis not present

## 2018-01-25 DIAGNOSIS — S42002D Fracture of unspecified part of left clavicle, subsequent encounter for fracture with routine healing: Secondary | ICD-10-CM | POA: Diagnosis not present

## 2018-01-25 DIAGNOSIS — R270 Ataxia, unspecified: Secondary | ICD-10-CM | POA: Diagnosis not present

## 2018-01-25 DIAGNOSIS — R42 Dizziness and giddiness: Secondary | ICD-10-CM | POA: Diagnosis not present

## 2018-01-25 DIAGNOSIS — E854 Organ-limited amyloidosis: Secondary | ICD-10-CM | POA: Diagnosis not present

## 2018-01-25 DIAGNOSIS — R69 Illness, unspecified: Secondary | ICD-10-CM | POA: Diagnosis not present

## 2018-01-26 DIAGNOSIS — R270 Ataxia, unspecified: Secondary | ICD-10-CM | POA: Diagnosis not present

## 2018-01-26 DIAGNOSIS — D649 Anemia, unspecified: Secondary | ICD-10-CM | POA: Diagnosis not present

## 2018-01-26 DIAGNOSIS — I2699 Other pulmonary embolism without acute cor pulmonale: Secondary | ICD-10-CM | POA: Diagnosis not present

## 2018-01-26 DIAGNOSIS — R69 Illness, unspecified: Secondary | ICD-10-CM | POA: Diagnosis not present

## 2018-01-26 DIAGNOSIS — H409 Unspecified glaucoma: Secondary | ICD-10-CM | POA: Diagnosis not present

## 2018-01-26 DIAGNOSIS — S42002D Fracture of unspecified part of left clavicle, subsequent encounter for fracture with routine healing: Secondary | ICD-10-CM | POA: Diagnosis not present

## 2018-01-26 DIAGNOSIS — I10 Essential (primary) hypertension: Secondary | ICD-10-CM | POA: Diagnosis not present

## 2018-01-26 DIAGNOSIS — E854 Organ-limited amyloidosis: Secondary | ICD-10-CM | POA: Diagnosis not present

## 2018-01-26 DIAGNOSIS — I680 Cerebral amyloid angiopathy: Secondary | ICD-10-CM | POA: Diagnosis not present

## 2018-01-26 DIAGNOSIS — R42 Dizziness and giddiness: Secondary | ICD-10-CM | POA: Diagnosis not present

## 2018-01-30 DIAGNOSIS — E854 Organ-limited amyloidosis: Secondary | ICD-10-CM | POA: Diagnosis not present

## 2018-01-30 DIAGNOSIS — R69 Illness, unspecified: Secondary | ICD-10-CM | POA: Diagnosis not present

## 2018-01-30 DIAGNOSIS — R42 Dizziness and giddiness: Secondary | ICD-10-CM | POA: Diagnosis not present

## 2018-01-30 DIAGNOSIS — D649 Anemia, unspecified: Secondary | ICD-10-CM | POA: Diagnosis not present

## 2018-01-30 DIAGNOSIS — S42002D Fracture of unspecified part of left clavicle, subsequent encounter for fracture with routine healing: Secondary | ICD-10-CM | POA: Diagnosis not present

## 2018-01-30 DIAGNOSIS — I680 Cerebral amyloid angiopathy: Secondary | ICD-10-CM | POA: Diagnosis not present

## 2018-01-30 DIAGNOSIS — I2699 Other pulmonary embolism without acute cor pulmonale: Secondary | ICD-10-CM | POA: Diagnosis not present

## 2018-01-30 DIAGNOSIS — I10 Essential (primary) hypertension: Secondary | ICD-10-CM | POA: Diagnosis not present

## 2018-01-30 DIAGNOSIS — H409 Unspecified glaucoma: Secondary | ICD-10-CM | POA: Diagnosis not present

## 2018-01-30 DIAGNOSIS — R270 Ataxia, unspecified: Secondary | ICD-10-CM | POA: Diagnosis not present

## 2018-02-01 DIAGNOSIS — E854 Organ-limited amyloidosis: Secondary | ICD-10-CM | POA: Diagnosis not present

## 2018-02-01 DIAGNOSIS — D649 Anemia, unspecified: Secondary | ICD-10-CM | POA: Diagnosis not present

## 2018-02-01 DIAGNOSIS — I10 Essential (primary) hypertension: Secondary | ICD-10-CM | POA: Diagnosis not present

## 2018-02-01 DIAGNOSIS — R270 Ataxia, unspecified: Secondary | ICD-10-CM | POA: Diagnosis not present

## 2018-02-01 DIAGNOSIS — S42002D Fracture of unspecified part of left clavicle, subsequent encounter for fracture with routine healing: Secondary | ICD-10-CM | POA: Diagnosis not present

## 2018-02-01 DIAGNOSIS — H409 Unspecified glaucoma: Secondary | ICD-10-CM | POA: Diagnosis not present

## 2018-02-01 DIAGNOSIS — I2699 Other pulmonary embolism without acute cor pulmonale: Secondary | ICD-10-CM | POA: Diagnosis not present

## 2018-02-01 DIAGNOSIS — R42 Dizziness and giddiness: Secondary | ICD-10-CM | POA: Diagnosis not present

## 2018-02-01 DIAGNOSIS — I680 Cerebral amyloid angiopathy: Secondary | ICD-10-CM | POA: Diagnosis not present

## 2018-02-01 DIAGNOSIS — R69 Illness, unspecified: Secondary | ICD-10-CM | POA: Diagnosis not present

## 2018-02-05 DIAGNOSIS — I10 Essential (primary) hypertension: Secondary | ICD-10-CM | POA: Diagnosis not present

## 2018-02-05 DIAGNOSIS — H409 Unspecified glaucoma: Secondary | ICD-10-CM | POA: Diagnosis not present

## 2018-02-05 DIAGNOSIS — S42002D Fracture of unspecified part of left clavicle, subsequent encounter for fracture with routine healing: Secondary | ICD-10-CM | POA: Diagnosis not present

## 2018-02-05 DIAGNOSIS — I2699 Other pulmonary embolism without acute cor pulmonale: Secondary | ICD-10-CM | POA: Diagnosis not present

## 2018-02-05 DIAGNOSIS — R69 Illness, unspecified: Secondary | ICD-10-CM | POA: Diagnosis not present

## 2018-02-05 DIAGNOSIS — I680 Cerebral amyloid angiopathy: Secondary | ICD-10-CM | POA: Diagnosis not present

## 2018-02-05 DIAGNOSIS — D649 Anemia, unspecified: Secondary | ICD-10-CM | POA: Diagnosis not present

## 2018-02-05 DIAGNOSIS — R42 Dizziness and giddiness: Secondary | ICD-10-CM | POA: Diagnosis not present

## 2018-02-05 DIAGNOSIS — R270 Ataxia, unspecified: Secondary | ICD-10-CM | POA: Diagnosis not present

## 2018-02-05 DIAGNOSIS — E854 Organ-limited amyloidosis: Secondary | ICD-10-CM | POA: Diagnosis not present

## 2018-02-06 DIAGNOSIS — R69 Illness, unspecified: Secondary | ICD-10-CM | POA: Diagnosis not present

## 2018-02-06 DIAGNOSIS — D649 Anemia, unspecified: Secondary | ICD-10-CM | POA: Diagnosis not present

## 2018-02-06 DIAGNOSIS — R270 Ataxia, unspecified: Secondary | ICD-10-CM | POA: Diagnosis not present

## 2018-02-06 DIAGNOSIS — I10 Essential (primary) hypertension: Secondary | ICD-10-CM | POA: Diagnosis not present

## 2018-02-06 DIAGNOSIS — S42002D Fracture of unspecified part of left clavicle, subsequent encounter for fracture with routine healing: Secondary | ICD-10-CM | POA: Diagnosis not present

## 2018-02-06 DIAGNOSIS — I2699 Other pulmonary embolism without acute cor pulmonale: Secondary | ICD-10-CM | POA: Diagnosis not present

## 2018-02-06 DIAGNOSIS — E854 Organ-limited amyloidosis: Secondary | ICD-10-CM | POA: Diagnosis not present

## 2018-02-06 DIAGNOSIS — H409 Unspecified glaucoma: Secondary | ICD-10-CM | POA: Diagnosis not present

## 2018-02-06 DIAGNOSIS — R42 Dizziness and giddiness: Secondary | ICD-10-CM | POA: Diagnosis not present

## 2018-02-06 DIAGNOSIS — I680 Cerebral amyloid angiopathy: Secondary | ICD-10-CM | POA: Diagnosis not present

## 2018-02-08 DIAGNOSIS — I680 Cerebral amyloid angiopathy: Secondary | ICD-10-CM | POA: Diagnosis not present

## 2018-02-08 DIAGNOSIS — R69 Illness, unspecified: Secondary | ICD-10-CM | POA: Diagnosis not present

## 2018-02-08 DIAGNOSIS — H409 Unspecified glaucoma: Secondary | ICD-10-CM | POA: Diagnosis not present

## 2018-02-08 DIAGNOSIS — R270 Ataxia, unspecified: Secondary | ICD-10-CM | POA: Diagnosis not present

## 2018-02-08 DIAGNOSIS — E854 Organ-limited amyloidosis: Secondary | ICD-10-CM | POA: Diagnosis not present

## 2018-02-08 DIAGNOSIS — D649 Anemia, unspecified: Secondary | ICD-10-CM | POA: Diagnosis not present

## 2018-02-08 DIAGNOSIS — I10 Essential (primary) hypertension: Secondary | ICD-10-CM | POA: Diagnosis not present

## 2018-02-08 DIAGNOSIS — R42 Dizziness and giddiness: Secondary | ICD-10-CM | POA: Diagnosis not present

## 2018-02-08 DIAGNOSIS — S42002D Fracture of unspecified part of left clavicle, subsequent encounter for fracture with routine healing: Secondary | ICD-10-CM | POA: Diagnosis not present

## 2018-02-08 DIAGNOSIS — I2699 Other pulmonary embolism without acute cor pulmonale: Secondary | ICD-10-CM | POA: Diagnosis not present

## 2018-02-12 DIAGNOSIS — D649 Anemia, unspecified: Secondary | ICD-10-CM | POA: Diagnosis not present

## 2018-02-12 DIAGNOSIS — N401 Enlarged prostate with lower urinary tract symptoms: Secondary | ICD-10-CM | POA: Diagnosis not present

## 2018-02-12 DIAGNOSIS — I2699 Other pulmonary embolism without acute cor pulmonale: Secondary | ICD-10-CM | POA: Diagnosis not present

## 2018-02-12 DIAGNOSIS — N471 Phimosis: Secondary | ICD-10-CM | POA: Diagnosis not present

## 2018-02-12 DIAGNOSIS — S42002D Fracture of unspecified part of left clavicle, subsequent encounter for fracture with routine healing: Secondary | ICD-10-CM | POA: Diagnosis not present

## 2018-02-12 DIAGNOSIS — N481 Balanitis: Secondary | ICD-10-CM | POA: Diagnosis not present

## 2018-02-12 DIAGNOSIS — E854 Organ-limited amyloidosis: Secondary | ICD-10-CM | POA: Diagnosis not present

## 2018-02-12 DIAGNOSIS — R69 Illness, unspecified: Secondary | ICD-10-CM | POA: Diagnosis not present

## 2018-02-12 DIAGNOSIS — R270 Ataxia, unspecified: Secondary | ICD-10-CM | POA: Diagnosis not present

## 2018-02-12 DIAGNOSIS — H409 Unspecified glaucoma: Secondary | ICD-10-CM | POA: Diagnosis not present

## 2018-02-12 DIAGNOSIS — R42 Dizziness and giddiness: Secondary | ICD-10-CM | POA: Diagnosis not present

## 2018-02-12 DIAGNOSIS — I10 Essential (primary) hypertension: Secondary | ICD-10-CM | POA: Diagnosis not present

## 2018-02-12 DIAGNOSIS — I680 Cerebral amyloid angiopathy: Secondary | ICD-10-CM | POA: Diagnosis not present

## 2018-02-12 DIAGNOSIS — R339 Retention of urine, unspecified: Secondary | ICD-10-CM | POA: Diagnosis not present

## 2018-02-15 DIAGNOSIS — D649 Anemia, unspecified: Secondary | ICD-10-CM | POA: Diagnosis not present

## 2018-02-15 DIAGNOSIS — I10 Essential (primary) hypertension: Secondary | ICD-10-CM | POA: Diagnosis not present

## 2018-02-15 DIAGNOSIS — H409 Unspecified glaucoma: Secondary | ICD-10-CM | POA: Diagnosis not present

## 2018-02-15 DIAGNOSIS — R42 Dizziness and giddiness: Secondary | ICD-10-CM | POA: Diagnosis not present

## 2018-02-15 DIAGNOSIS — R69 Illness, unspecified: Secondary | ICD-10-CM | POA: Diagnosis not present

## 2018-02-15 DIAGNOSIS — S42002D Fracture of unspecified part of left clavicle, subsequent encounter for fracture with routine healing: Secondary | ICD-10-CM | POA: Diagnosis not present

## 2018-02-15 DIAGNOSIS — E854 Organ-limited amyloidosis: Secondary | ICD-10-CM | POA: Diagnosis not present

## 2018-02-15 DIAGNOSIS — I2699 Other pulmonary embolism without acute cor pulmonale: Secondary | ICD-10-CM | POA: Diagnosis not present

## 2018-02-15 DIAGNOSIS — I680 Cerebral amyloid angiopathy: Secondary | ICD-10-CM | POA: Diagnosis not present

## 2018-02-15 DIAGNOSIS — R270 Ataxia, unspecified: Secondary | ICD-10-CM | POA: Diagnosis not present

## 2018-02-16 DIAGNOSIS — R4189 Other symptoms and signs involving cognitive functions and awareness: Secondary | ICD-10-CM | POA: Diagnosis not present

## 2018-02-16 DIAGNOSIS — I2699 Other pulmonary embolism without acute cor pulmonale: Secondary | ICD-10-CM | POA: Diagnosis not present

## 2018-02-16 DIAGNOSIS — Z79899 Other long term (current) drug therapy: Secondary | ICD-10-CM | POA: Diagnosis not present

## 2018-02-16 DIAGNOSIS — R69 Illness, unspecified: Secondary | ICD-10-CM | POA: Diagnosis not present

## 2018-02-16 DIAGNOSIS — Z6821 Body mass index (BMI) 21.0-21.9, adult: Secondary | ICD-10-CM | POA: Diagnosis not present

## 2018-02-23 DIAGNOSIS — N179 Acute kidney failure, unspecified: Secondary | ICD-10-CM | POA: Diagnosis not present

## 2018-02-23 DIAGNOSIS — G4700 Insomnia, unspecified: Secondary | ICD-10-CM | POA: Diagnosis not present

## 2018-02-23 DIAGNOSIS — I10 Essential (primary) hypertension: Secondary | ICD-10-CM | POA: Diagnosis not present

## 2018-02-23 DIAGNOSIS — R42 Dizziness and giddiness: Secondary | ICD-10-CM | POA: Diagnosis not present

## 2018-02-23 DIAGNOSIS — R5381 Other malaise: Secondary | ICD-10-CM | POA: Diagnosis not present

## 2018-02-23 DIAGNOSIS — R69 Illness, unspecified: Secondary | ICD-10-CM | POA: Diagnosis not present

## 2018-02-23 DIAGNOSIS — R270 Ataxia, unspecified: Secondary | ICD-10-CM | POA: Diagnosis not present

## 2018-02-23 DIAGNOSIS — K219 Gastro-esophageal reflux disease without esophagitis: Secondary | ICD-10-CM | POA: Diagnosis not present

## 2018-02-27 DIAGNOSIS — R69 Illness, unspecified: Secondary | ICD-10-CM | POA: Diagnosis not present

## 2018-02-27 DIAGNOSIS — S42002D Fracture of unspecified part of left clavicle, subsequent encounter for fracture with routine healing: Secondary | ICD-10-CM | POA: Diagnosis not present

## 2018-02-27 DIAGNOSIS — I10 Essential (primary) hypertension: Secondary | ICD-10-CM | POA: Diagnosis not present

## 2018-02-27 DIAGNOSIS — H409 Unspecified glaucoma: Secondary | ICD-10-CM | POA: Diagnosis not present

## 2018-02-27 DIAGNOSIS — I2699 Other pulmonary embolism without acute cor pulmonale: Secondary | ICD-10-CM | POA: Diagnosis not present

## 2018-02-27 DIAGNOSIS — D649 Anemia, unspecified: Secondary | ICD-10-CM | POA: Diagnosis not present

## 2018-02-27 DIAGNOSIS — I680 Cerebral amyloid angiopathy: Secondary | ICD-10-CM | POA: Diagnosis not present

## 2018-02-27 DIAGNOSIS — R42 Dizziness and giddiness: Secondary | ICD-10-CM | POA: Diagnosis not present

## 2018-02-27 DIAGNOSIS — R270 Ataxia, unspecified: Secondary | ICD-10-CM | POA: Diagnosis not present

## 2018-02-27 DIAGNOSIS — E854 Organ-limited amyloidosis: Secondary | ICD-10-CM | POA: Diagnosis not present

## 2018-03-05 DIAGNOSIS — D649 Anemia, unspecified: Secondary | ICD-10-CM | POA: Diagnosis not present

## 2018-03-05 DIAGNOSIS — R269 Unspecified abnormalities of gait and mobility: Secondary | ICD-10-CM | POA: Diagnosis not present

## 2018-03-05 DIAGNOSIS — R69 Illness, unspecified: Secondary | ICD-10-CM | POA: Diagnosis not present

## 2018-03-05 DIAGNOSIS — Z6821 Body mass index (BMI) 21.0-21.9, adult: Secondary | ICD-10-CM | POA: Diagnosis not present

## 2018-03-05 DIAGNOSIS — Z79899 Other long term (current) drug therapy: Secondary | ICD-10-CM | POA: Diagnosis not present

## 2018-03-09 DIAGNOSIS — Z9181 History of falling: Secondary | ICD-10-CM | POA: Diagnosis not present

## 2018-03-09 DIAGNOSIS — N401 Enlarged prostate with lower urinary tract symptoms: Secondary | ICD-10-CM | POA: Diagnosis not present

## 2018-03-09 DIAGNOSIS — R69 Illness, unspecified: Secondary | ICD-10-CM | POA: Diagnosis not present

## 2018-03-09 DIAGNOSIS — I680 Cerebral amyloid angiopathy: Secondary | ICD-10-CM | POA: Diagnosis not present

## 2018-03-09 DIAGNOSIS — I10 Essential (primary) hypertension: Secondary | ICD-10-CM | POA: Diagnosis not present

## 2018-03-09 DIAGNOSIS — Z86711 Personal history of pulmonary embolism: Secondary | ICD-10-CM | POA: Diagnosis not present

## 2018-03-09 DIAGNOSIS — Z7982 Long term (current) use of aspirin: Secondary | ICD-10-CM | POA: Diagnosis not present

## 2018-03-09 DIAGNOSIS — E854 Organ-limited amyloidosis: Secondary | ICD-10-CM | POA: Diagnosis not present

## 2018-03-09 DIAGNOSIS — D649 Anemia, unspecified: Secondary | ICD-10-CM | POA: Diagnosis not present

## 2018-03-09 DIAGNOSIS — H409 Unspecified glaucoma: Secondary | ICD-10-CM | POA: Diagnosis not present

## 2018-03-21 DIAGNOSIS — I10 Essential (primary) hypertension: Secondary | ICD-10-CM | POA: Diagnosis not present

## 2018-03-21 DIAGNOSIS — H409 Unspecified glaucoma: Secondary | ICD-10-CM | POA: Diagnosis not present

## 2018-03-21 DIAGNOSIS — E854 Organ-limited amyloidosis: Secondary | ICD-10-CM | POA: Diagnosis not present

## 2018-03-21 DIAGNOSIS — D649 Anemia, unspecified: Secondary | ICD-10-CM | POA: Diagnosis not present

## 2018-03-21 DIAGNOSIS — I680 Cerebral amyloid angiopathy: Secondary | ICD-10-CM | POA: Diagnosis not present

## 2018-03-21 DIAGNOSIS — N401 Enlarged prostate with lower urinary tract symptoms: Secondary | ICD-10-CM | POA: Diagnosis not present

## 2018-03-25 DIAGNOSIS — G4700 Insomnia, unspecified: Secondary | ICD-10-CM | POA: Diagnosis not present

## 2018-03-25 DIAGNOSIS — N179 Acute kidney failure, unspecified: Secondary | ICD-10-CM | POA: Diagnosis not present

## 2018-03-25 DIAGNOSIS — R42 Dizziness and giddiness: Secondary | ICD-10-CM | POA: Diagnosis not present

## 2018-03-25 DIAGNOSIS — I10 Essential (primary) hypertension: Secondary | ICD-10-CM | POA: Diagnosis not present

## 2018-03-25 DIAGNOSIS — R69 Illness, unspecified: Secondary | ICD-10-CM | POA: Diagnosis not present

## 2018-03-25 DIAGNOSIS — R270 Ataxia, unspecified: Secondary | ICD-10-CM | POA: Diagnosis not present

## 2018-03-25 DIAGNOSIS — R5381 Other malaise: Secondary | ICD-10-CM | POA: Diagnosis not present

## 2018-03-25 DIAGNOSIS — K219 Gastro-esophageal reflux disease without esophagitis: Secondary | ICD-10-CM | POA: Diagnosis not present

## 2018-03-26 DIAGNOSIS — N481 Balanitis: Secondary | ICD-10-CM | POA: Diagnosis not present

## 2018-03-26 DIAGNOSIS — R339 Retention of urine, unspecified: Secondary | ICD-10-CM | POA: Diagnosis not present

## 2018-03-26 DIAGNOSIS — N471 Phimosis: Secondary | ICD-10-CM | POA: Diagnosis not present

## 2018-03-26 DIAGNOSIS — N401 Enlarged prostate with lower urinary tract symptoms: Secondary | ICD-10-CM | POA: Diagnosis not present

## 2018-04-03 DIAGNOSIS — D509 Iron deficiency anemia, unspecified: Secondary | ICD-10-CM | POA: Diagnosis not present

## 2018-04-03 DIAGNOSIS — E785 Hyperlipidemia, unspecified: Secondary | ICD-10-CM | POA: Diagnosis not present

## 2018-04-03 DIAGNOSIS — I680 Cerebral amyloid angiopathy: Secondary | ICD-10-CM | POA: Diagnosis not present

## 2018-04-03 DIAGNOSIS — L219 Seborrheic dermatitis, unspecified: Secondary | ICD-10-CM | POA: Diagnosis not present

## 2018-04-03 DIAGNOSIS — R69 Illness, unspecified: Secondary | ICD-10-CM | POA: Diagnosis not present

## 2018-04-03 DIAGNOSIS — N401 Enlarged prostate with lower urinary tract symptoms: Secondary | ICD-10-CM | POA: Diagnosis not present

## 2018-04-03 DIAGNOSIS — K219 Gastro-esophageal reflux disease without esophagitis: Secondary | ICD-10-CM | POA: Diagnosis not present

## 2018-04-03 DIAGNOSIS — Z86711 Personal history of pulmonary embolism: Secondary | ICD-10-CM | POA: Diagnosis not present

## 2018-04-06 DIAGNOSIS — H409 Unspecified glaucoma: Secondary | ICD-10-CM | POA: Diagnosis not present

## 2018-04-06 DIAGNOSIS — Z86711 Personal history of pulmonary embolism: Secondary | ICD-10-CM | POA: Diagnosis not present

## 2018-04-06 DIAGNOSIS — R69 Illness, unspecified: Secondary | ICD-10-CM | POA: Diagnosis not present

## 2018-04-06 DIAGNOSIS — I680 Cerebral amyloid angiopathy: Secondary | ICD-10-CM | POA: Diagnosis not present

## 2018-04-06 DIAGNOSIS — N401 Enlarged prostate with lower urinary tract symptoms: Secondary | ICD-10-CM | POA: Diagnosis not present

## 2018-04-06 DIAGNOSIS — I10 Essential (primary) hypertension: Secondary | ICD-10-CM | POA: Diagnosis not present

## 2018-04-06 DIAGNOSIS — E854 Organ-limited amyloidosis: Secondary | ICD-10-CM | POA: Diagnosis not present

## 2018-04-06 DIAGNOSIS — D649 Anemia, unspecified: Secondary | ICD-10-CM | POA: Diagnosis not present

## 2018-04-06 DIAGNOSIS — Z9181 History of falling: Secondary | ICD-10-CM | POA: Diagnosis not present

## 2018-04-06 DIAGNOSIS — Z7982 Long term (current) use of aspirin: Secondary | ICD-10-CM | POA: Diagnosis not present

## 2018-04-23 DIAGNOSIS — D509 Iron deficiency anemia, unspecified: Secondary | ICD-10-CM | POA: Diagnosis not present

## 2018-04-23 DIAGNOSIS — K5900 Constipation, unspecified: Secondary | ICD-10-CM | POA: Diagnosis not present

## 2018-04-25 DIAGNOSIS — R270 Ataxia, unspecified: Secondary | ICD-10-CM | POA: Diagnosis not present

## 2018-04-25 DIAGNOSIS — R69 Illness, unspecified: Secondary | ICD-10-CM | POA: Diagnosis not present

## 2018-04-25 DIAGNOSIS — I10 Essential (primary) hypertension: Secondary | ICD-10-CM | POA: Diagnosis not present

## 2018-04-25 DIAGNOSIS — N179 Acute kidney failure, unspecified: Secondary | ICD-10-CM | POA: Diagnosis not present

## 2018-04-25 DIAGNOSIS — R5381 Other malaise: Secondary | ICD-10-CM | POA: Diagnosis not present

## 2018-04-25 DIAGNOSIS — G4700 Insomnia, unspecified: Secondary | ICD-10-CM | POA: Diagnosis not present

## 2018-04-25 DIAGNOSIS — K219 Gastro-esophageal reflux disease without esophagitis: Secondary | ICD-10-CM | POA: Diagnosis not present

## 2018-04-25 DIAGNOSIS — R42 Dizziness and giddiness: Secondary | ICD-10-CM | POA: Diagnosis not present

## 2018-05-15 DIAGNOSIS — R062 Wheezing: Secondary | ICD-10-CM | POA: Diagnosis not present

## 2018-05-15 DIAGNOSIS — R69 Illness, unspecified: Secondary | ICD-10-CM | POA: Diagnosis not present

## 2018-05-15 DIAGNOSIS — Z6822 Body mass index (BMI) 22.0-22.9, adult: Secondary | ICD-10-CM | POA: Diagnosis not present

## 2018-05-15 DIAGNOSIS — R05 Cough: Secondary | ICD-10-CM | POA: Diagnosis not present

## 2018-05-18 DIAGNOSIS — Z86711 Personal history of pulmonary embolism: Secondary | ICD-10-CM | POA: Diagnosis not present

## 2018-05-18 DIAGNOSIS — R69 Illness, unspecified: Secondary | ICD-10-CM | POA: Diagnosis not present

## 2018-05-18 DIAGNOSIS — I10 Essential (primary) hypertension: Secondary | ICD-10-CM | POA: Diagnosis not present

## 2018-05-18 DIAGNOSIS — E854 Organ-limited amyloidosis: Secondary | ICD-10-CM | POA: Diagnosis not present

## 2018-05-18 DIAGNOSIS — K219 Gastro-esophageal reflux disease without esophagitis: Secondary | ICD-10-CM | POA: Diagnosis not present

## 2018-05-18 DIAGNOSIS — Z7902 Long term (current) use of antithrombotics/antiplatelets: Secondary | ICD-10-CM | POA: Diagnosis not present

## 2018-05-18 DIAGNOSIS — R000 Tachycardia, unspecified: Secondary | ICD-10-CM | POA: Diagnosis not present

## 2018-05-18 DIAGNOSIS — K449 Diaphragmatic hernia without obstruction or gangrene: Secondary | ICD-10-CM | POA: Diagnosis not present

## 2018-05-18 DIAGNOSIS — R509 Fever, unspecified: Secondary | ICD-10-CM | POA: Diagnosis not present

## 2018-05-18 DIAGNOSIS — J90 Pleural effusion, not elsewhere classified: Secondary | ICD-10-CM | POA: Diagnosis not present

## 2018-05-18 DIAGNOSIS — R627 Adult failure to thrive: Secondary | ICD-10-CM | POA: Diagnosis not present

## 2018-05-18 DIAGNOSIS — F0391 Unspecified dementia with behavioral disturbance: Secondary | ICD-10-CM | POA: Diagnosis not present

## 2018-05-18 DIAGNOSIS — E860 Dehydration: Secondary | ICD-10-CM | POA: Diagnosis not present

## 2018-05-18 DIAGNOSIS — G9341 Metabolic encephalopathy: Secondary | ICD-10-CM | POA: Diagnosis not present

## 2018-05-18 DIAGNOSIS — A419 Sepsis, unspecified organism: Secondary | ICD-10-CM | POA: Diagnosis not present

## 2018-05-18 DIAGNOSIS — R262 Difficulty in walking, not elsewhere classified: Secondary | ICD-10-CM | POA: Diagnosis not present

## 2018-05-18 DIAGNOSIS — R7989 Other specified abnormal findings of blood chemistry: Secondary | ICD-10-CM | POA: Diagnosis not present

## 2018-05-18 DIAGNOSIS — J9811 Atelectasis: Secondary | ICD-10-CM | POA: Diagnosis not present

## 2018-05-18 DIAGNOSIS — R4182 Altered mental status, unspecified: Secondary | ICD-10-CM | POA: Diagnosis not present

## 2018-05-19 DIAGNOSIS — E854 Organ-limited amyloidosis: Secondary | ICD-10-CM

## 2018-05-19 DIAGNOSIS — J90 Pleural effusion, not elsewhere classified: Secondary | ICD-10-CM | POA: Diagnosis not present

## 2018-05-19 DIAGNOSIS — I2699 Other pulmonary embolism without acute cor pulmonale: Secondary | ICD-10-CM | POA: Diagnosis not present

## 2018-05-19 DIAGNOSIS — I10 Essential (primary) hypertension: Secondary | ICD-10-CM

## 2018-05-19 DIAGNOSIS — R509 Fever, unspecified: Secondary | ICD-10-CM | POA: Diagnosis not present

## 2018-05-19 DIAGNOSIS — G9341 Metabolic encephalopathy: Secondary | ICD-10-CM | POA: Diagnosis not present

## 2018-05-19 DIAGNOSIS — R7989 Other specified abnormal findings of blood chemistry: Secondary | ICD-10-CM

## 2018-05-19 DIAGNOSIS — A419 Sepsis, unspecified organism: Secondary | ICD-10-CM | POA: Diagnosis not present

## 2018-05-19 DIAGNOSIS — F0390 Unspecified dementia without behavioral disturbance: Secondary | ICD-10-CM

## 2018-05-19 DIAGNOSIS — K449 Diaphragmatic hernia without obstruction or gangrene: Secondary | ICD-10-CM | POA: Diagnosis not present

## 2018-05-19 DIAGNOSIS — D649 Anemia, unspecified: Secondary | ICD-10-CM

## 2018-05-19 DIAGNOSIS — R69 Illness, unspecified: Secondary | ICD-10-CM | POA: Diagnosis not present

## 2018-05-19 DIAGNOSIS — E860 Dehydration: Secondary | ICD-10-CM

## 2018-05-19 DIAGNOSIS — R627 Adult failure to thrive: Secondary | ICD-10-CM

## 2018-05-20 DIAGNOSIS — D649 Anemia, unspecified: Secondary | ICD-10-CM | POA: Diagnosis not present

## 2018-05-20 DIAGNOSIS — F0390 Unspecified dementia without behavioral disturbance: Secondary | ICD-10-CM | POA: Diagnosis not present

## 2018-05-20 DIAGNOSIS — I10 Essential (primary) hypertension: Secondary | ICD-10-CM | POA: Diagnosis not present

## 2018-05-20 DIAGNOSIS — R627 Adult failure to thrive: Secondary | ICD-10-CM | POA: Diagnosis not present

## 2018-05-20 DIAGNOSIS — E854 Organ-limited amyloidosis: Secondary | ICD-10-CM | POA: Diagnosis not present

## 2018-05-20 DIAGNOSIS — R7989 Other specified abnormal findings of blood chemistry: Secondary | ICD-10-CM | POA: Diagnosis not present

## 2018-05-20 DIAGNOSIS — R509 Fever, unspecified: Secondary | ICD-10-CM | POA: Diagnosis not present

## 2018-05-20 DIAGNOSIS — G9341 Metabolic encephalopathy: Secondary | ICD-10-CM | POA: Diagnosis not present

## 2018-05-20 DIAGNOSIS — R69 Illness, unspecified: Secondary | ICD-10-CM | POA: Diagnosis not present

## 2018-05-20 DIAGNOSIS — E860 Dehydration: Secondary | ICD-10-CM | POA: Diagnosis not present

## 2018-05-22 DIAGNOSIS — R69 Illness, unspecified: Secondary | ICD-10-CM | POA: Diagnosis not present

## 2018-05-22 DIAGNOSIS — H409 Unspecified glaucoma: Secondary | ICD-10-CM | POA: Diagnosis not present

## 2018-05-22 DIAGNOSIS — G9341 Metabolic encephalopathy: Secondary | ICD-10-CM | POA: Diagnosis not present

## 2018-05-22 DIAGNOSIS — M4854XD Collapsed vertebra, not elsewhere classified, thoracic region, subsequent encounter for fracture with routine healing: Secondary | ICD-10-CM | POA: Diagnosis not present

## 2018-05-22 DIAGNOSIS — Z86711 Personal history of pulmonary embolism: Secondary | ICD-10-CM | POA: Diagnosis not present

## 2018-05-22 DIAGNOSIS — Z9181 History of falling: Secondary | ICD-10-CM | POA: Diagnosis not present

## 2018-05-22 DIAGNOSIS — I10 Essential (primary) hypertension: Secondary | ICD-10-CM | POA: Diagnosis not present

## 2018-05-22 DIAGNOSIS — N401 Enlarged prostate with lower urinary tract symptoms: Secondary | ICD-10-CM | POA: Diagnosis not present

## 2018-05-22 DIAGNOSIS — D649 Anemia, unspecified: Secondary | ICD-10-CM | POA: Diagnosis not present

## 2018-05-22 DIAGNOSIS — M4856XD Collapsed vertebra, not elsewhere classified, lumbar region, subsequent encounter for fracture with routine healing: Secondary | ICD-10-CM | POA: Diagnosis not present

## 2018-05-24 DIAGNOSIS — N401 Enlarged prostate with lower urinary tract symptoms: Secondary | ICD-10-CM | POA: Diagnosis not present

## 2018-05-24 DIAGNOSIS — Z86711 Personal history of pulmonary embolism: Secondary | ICD-10-CM | POA: Diagnosis not present

## 2018-05-24 DIAGNOSIS — M4856XD Collapsed vertebra, not elsewhere classified, lumbar region, subsequent encounter for fracture with routine healing: Secondary | ICD-10-CM | POA: Diagnosis not present

## 2018-05-24 DIAGNOSIS — G9341 Metabolic encephalopathy: Secondary | ICD-10-CM | POA: Diagnosis not present

## 2018-05-24 DIAGNOSIS — D649 Anemia, unspecified: Secondary | ICD-10-CM | POA: Diagnosis not present

## 2018-05-24 DIAGNOSIS — R69 Illness, unspecified: Secondary | ICD-10-CM | POA: Diagnosis not present

## 2018-05-24 DIAGNOSIS — M4854XD Collapsed vertebra, not elsewhere classified, thoracic region, subsequent encounter for fracture with routine healing: Secondary | ICD-10-CM | POA: Diagnosis not present

## 2018-05-24 DIAGNOSIS — Z9181 History of falling: Secondary | ICD-10-CM | POA: Diagnosis not present

## 2018-05-24 DIAGNOSIS — H409 Unspecified glaucoma: Secondary | ICD-10-CM | POA: Diagnosis not present

## 2018-05-24 DIAGNOSIS — I10 Essential (primary) hypertension: Secondary | ICD-10-CM | POA: Diagnosis not present

## 2018-05-26 DIAGNOSIS — R270 Ataxia, unspecified: Secondary | ICD-10-CM | POA: Diagnosis not present

## 2018-05-26 DIAGNOSIS — N179 Acute kidney failure, unspecified: Secondary | ICD-10-CM | POA: Diagnosis not present

## 2018-05-26 DIAGNOSIS — I10 Essential (primary) hypertension: Secondary | ICD-10-CM | POA: Diagnosis not present

## 2018-05-26 DIAGNOSIS — R42 Dizziness and giddiness: Secondary | ICD-10-CM | POA: Diagnosis not present

## 2018-05-26 DIAGNOSIS — R69 Illness, unspecified: Secondary | ICD-10-CM | POA: Diagnosis not present

## 2018-05-26 DIAGNOSIS — K219 Gastro-esophageal reflux disease without esophagitis: Secondary | ICD-10-CM | POA: Diagnosis not present

## 2018-05-26 DIAGNOSIS — R5381 Other malaise: Secondary | ICD-10-CM | POA: Diagnosis not present

## 2018-05-26 DIAGNOSIS — G4700 Insomnia, unspecified: Secondary | ICD-10-CM | POA: Diagnosis not present

## 2018-05-28 DIAGNOSIS — D649 Anemia, unspecified: Secondary | ICD-10-CM | POA: Diagnosis not present

## 2018-05-29 DIAGNOSIS — M4856XD Collapsed vertebra, not elsewhere classified, lumbar region, subsequent encounter for fracture with routine healing: Secondary | ICD-10-CM | POA: Diagnosis not present

## 2018-05-29 DIAGNOSIS — R69 Illness, unspecified: Secondary | ICD-10-CM | POA: Diagnosis not present

## 2018-05-29 DIAGNOSIS — M4854XD Collapsed vertebra, not elsewhere classified, thoracic region, subsequent encounter for fracture with routine healing: Secondary | ICD-10-CM | POA: Diagnosis not present

## 2018-05-29 DIAGNOSIS — G9341 Metabolic encephalopathy: Secondary | ICD-10-CM | POA: Diagnosis not present

## 2018-05-29 DIAGNOSIS — N401 Enlarged prostate with lower urinary tract symptoms: Secondary | ICD-10-CM | POA: Diagnosis not present

## 2018-05-29 DIAGNOSIS — Z86711 Personal history of pulmonary embolism: Secondary | ICD-10-CM | POA: Diagnosis not present

## 2018-05-29 DIAGNOSIS — H409 Unspecified glaucoma: Secondary | ICD-10-CM | POA: Diagnosis not present

## 2018-05-29 DIAGNOSIS — D649 Anemia, unspecified: Secondary | ICD-10-CM | POA: Diagnosis not present

## 2018-05-29 DIAGNOSIS — Z9181 History of falling: Secondary | ICD-10-CM | POA: Diagnosis not present

## 2018-05-29 DIAGNOSIS — I10 Essential (primary) hypertension: Secondary | ICD-10-CM | POA: Diagnosis not present

## 2018-06-04 DIAGNOSIS — R69 Illness, unspecified: Secondary | ICD-10-CM | POA: Diagnosis not present

## 2018-06-04 DIAGNOSIS — M4856XD Collapsed vertebra, not elsewhere classified, lumbar region, subsequent encounter for fracture with routine healing: Secondary | ICD-10-CM | POA: Diagnosis not present

## 2018-06-04 DIAGNOSIS — D649 Anemia, unspecified: Secondary | ICD-10-CM | POA: Diagnosis not present

## 2018-06-04 DIAGNOSIS — I10 Essential (primary) hypertension: Secondary | ICD-10-CM | POA: Diagnosis not present

## 2018-06-04 DIAGNOSIS — N401 Enlarged prostate with lower urinary tract symptoms: Secondary | ICD-10-CM | POA: Diagnosis not present

## 2018-06-04 DIAGNOSIS — M4854XD Collapsed vertebra, not elsewhere classified, thoracic region, subsequent encounter for fracture with routine healing: Secondary | ICD-10-CM | POA: Diagnosis not present

## 2018-06-04 DIAGNOSIS — G9341 Metabolic encephalopathy: Secondary | ICD-10-CM | POA: Diagnosis not present

## 2018-06-04 DIAGNOSIS — Z9181 History of falling: Secondary | ICD-10-CM | POA: Diagnosis not present

## 2018-06-04 DIAGNOSIS — Z86711 Personal history of pulmonary embolism: Secondary | ICD-10-CM | POA: Diagnosis not present

## 2018-06-04 DIAGNOSIS — H409 Unspecified glaucoma: Secondary | ICD-10-CM | POA: Diagnosis not present

## 2018-06-08 DIAGNOSIS — I10 Essential (primary) hypertension: Secondary | ICD-10-CM | POA: Diagnosis not present

## 2018-06-08 DIAGNOSIS — R69 Illness, unspecified: Secondary | ICD-10-CM | POA: Diagnosis not present

## 2018-06-08 DIAGNOSIS — M4854XD Collapsed vertebra, not elsewhere classified, thoracic region, subsequent encounter for fracture with routine healing: Secondary | ICD-10-CM | POA: Diagnosis not present

## 2018-06-08 DIAGNOSIS — D649 Anemia, unspecified: Secondary | ICD-10-CM | POA: Diagnosis not present

## 2018-06-08 DIAGNOSIS — Z86711 Personal history of pulmonary embolism: Secondary | ICD-10-CM | POA: Diagnosis not present

## 2018-06-08 DIAGNOSIS — G9341 Metabolic encephalopathy: Secondary | ICD-10-CM | POA: Diagnosis not present

## 2018-06-08 DIAGNOSIS — Z9181 History of falling: Secondary | ICD-10-CM | POA: Diagnosis not present

## 2018-06-08 DIAGNOSIS — N401 Enlarged prostate with lower urinary tract symptoms: Secondary | ICD-10-CM | POA: Diagnosis not present

## 2018-06-08 DIAGNOSIS — H409 Unspecified glaucoma: Secondary | ICD-10-CM | POA: Diagnosis not present

## 2018-06-08 DIAGNOSIS — M4856XD Collapsed vertebra, not elsewhere classified, lumbar region, subsequent encounter for fracture with routine healing: Secondary | ICD-10-CM | POA: Diagnosis not present

## 2018-06-12 DIAGNOSIS — D509 Iron deficiency anemia, unspecified: Secondary | ICD-10-CM | POA: Diagnosis not present

## 2018-06-12 DIAGNOSIS — M549 Dorsalgia, unspecified: Secondary | ICD-10-CM | POA: Diagnosis not present

## 2018-06-12 DIAGNOSIS — I10 Essential (primary) hypertension: Secondary | ICD-10-CM | POA: Diagnosis not present

## 2018-06-12 DIAGNOSIS — H409 Unspecified glaucoma: Secondary | ICD-10-CM | POA: Diagnosis not present

## 2018-06-12 DIAGNOSIS — Z9181 History of falling: Secondary | ICD-10-CM | POA: Diagnosis not present

## 2018-06-12 DIAGNOSIS — Z86711 Personal history of pulmonary embolism: Secondary | ICD-10-CM | POA: Diagnosis not present

## 2018-06-12 DIAGNOSIS — R05 Cough: Secondary | ICD-10-CM | POA: Diagnosis not present

## 2018-06-12 DIAGNOSIS — Z6821 Body mass index (BMI) 21.0-21.9, adult: Secondary | ICD-10-CM | POA: Diagnosis not present

## 2018-06-12 DIAGNOSIS — G9341 Metabolic encephalopathy: Secondary | ICD-10-CM | POA: Diagnosis not present

## 2018-06-12 DIAGNOSIS — M4854XD Collapsed vertebra, not elsewhere classified, thoracic region, subsequent encounter for fracture with routine healing: Secondary | ICD-10-CM | POA: Diagnosis not present

## 2018-06-12 DIAGNOSIS — M4856XD Collapsed vertebra, not elsewhere classified, lumbar region, subsequent encounter for fracture with routine healing: Secondary | ICD-10-CM | POA: Diagnosis not present

## 2018-06-12 DIAGNOSIS — N401 Enlarged prostate with lower urinary tract symptoms: Secondary | ICD-10-CM | POA: Diagnosis not present

## 2018-06-12 DIAGNOSIS — D649 Anemia, unspecified: Secondary | ICD-10-CM | POA: Diagnosis not present

## 2018-06-12 DIAGNOSIS — R69 Illness, unspecified: Secondary | ICD-10-CM | POA: Diagnosis not present

## 2018-06-12 DIAGNOSIS — R6889 Other general symptoms and signs: Secondary | ICD-10-CM | POA: Diagnosis not present

## 2018-06-20 DIAGNOSIS — R69 Illness, unspecified: Secondary | ICD-10-CM | POA: Diagnosis not present

## 2018-06-20 DIAGNOSIS — Z9181 History of falling: Secondary | ICD-10-CM | POA: Diagnosis not present

## 2018-06-20 DIAGNOSIS — I10 Essential (primary) hypertension: Secondary | ICD-10-CM | POA: Diagnosis not present

## 2018-06-20 DIAGNOSIS — Z86711 Personal history of pulmonary embolism: Secondary | ICD-10-CM | POA: Diagnosis not present

## 2018-06-20 DIAGNOSIS — H409 Unspecified glaucoma: Secondary | ICD-10-CM | POA: Diagnosis not present

## 2018-06-20 DIAGNOSIS — M4856XD Collapsed vertebra, not elsewhere classified, lumbar region, subsequent encounter for fracture with routine healing: Secondary | ICD-10-CM | POA: Diagnosis not present

## 2018-06-20 DIAGNOSIS — N401 Enlarged prostate with lower urinary tract symptoms: Secondary | ICD-10-CM | POA: Diagnosis not present

## 2018-06-20 DIAGNOSIS — D649 Anemia, unspecified: Secondary | ICD-10-CM | POA: Diagnosis not present

## 2018-06-20 DIAGNOSIS — G9341 Metabolic encephalopathy: Secondary | ICD-10-CM | POA: Diagnosis not present

## 2018-06-20 DIAGNOSIS — M4854XD Collapsed vertebra, not elsewhere classified, thoracic region, subsequent encounter for fracture with routine healing: Secondary | ICD-10-CM | POA: Diagnosis not present

## 2018-06-24 DIAGNOSIS — R42 Dizziness and giddiness: Secondary | ICD-10-CM | POA: Diagnosis not present

## 2018-06-24 DIAGNOSIS — R270 Ataxia, unspecified: Secondary | ICD-10-CM | POA: Diagnosis not present

## 2018-06-24 DIAGNOSIS — G4700 Insomnia, unspecified: Secondary | ICD-10-CM | POA: Diagnosis not present

## 2018-06-24 DIAGNOSIS — R69 Illness, unspecified: Secondary | ICD-10-CM | POA: Diagnosis not present

## 2018-06-24 DIAGNOSIS — I10 Essential (primary) hypertension: Secondary | ICD-10-CM | POA: Diagnosis not present

## 2018-06-24 DIAGNOSIS — K219 Gastro-esophageal reflux disease without esophagitis: Secondary | ICD-10-CM | POA: Diagnosis not present

## 2018-06-24 DIAGNOSIS — N179 Acute kidney failure, unspecified: Secondary | ICD-10-CM | POA: Diagnosis not present

## 2018-06-24 DIAGNOSIS — R5381 Other malaise: Secondary | ICD-10-CM | POA: Diagnosis not present

## 2018-07-17 DIAGNOSIS — M4854XD Collapsed vertebra, not elsewhere classified, thoracic region, subsequent encounter for fracture with routine healing: Secondary | ICD-10-CM | POA: Diagnosis not present

## 2018-07-17 DIAGNOSIS — M4856XD Collapsed vertebra, not elsewhere classified, lumbar region, subsequent encounter for fracture with routine healing: Secondary | ICD-10-CM | POA: Diagnosis not present

## 2018-07-17 DIAGNOSIS — D649 Anemia, unspecified: Secondary | ICD-10-CM | POA: Diagnosis not present

## 2018-07-17 DIAGNOSIS — G9341 Metabolic encephalopathy: Secondary | ICD-10-CM | POA: Diagnosis not present

## 2018-07-17 DIAGNOSIS — Z86711 Personal history of pulmonary embolism: Secondary | ICD-10-CM | POA: Diagnosis not present

## 2018-07-17 DIAGNOSIS — H409 Unspecified glaucoma: Secondary | ICD-10-CM | POA: Diagnosis not present

## 2018-07-17 DIAGNOSIS — R69 Illness, unspecified: Secondary | ICD-10-CM | POA: Diagnosis not present

## 2018-07-17 DIAGNOSIS — Z9181 History of falling: Secondary | ICD-10-CM | POA: Diagnosis not present

## 2018-07-17 DIAGNOSIS — N401 Enlarged prostate with lower urinary tract symptoms: Secondary | ICD-10-CM | POA: Diagnosis not present

## 2018-07-17 DIAGNOSIS — I10 Essential (primary) hypertension: Secondary | ICD-10-CM | POA: Diagnosis not present

## 2018-07-21 DIAGNOSIS — I10 Essential (primary) hypertension: Secondary | ICD-10-CM | POA: Diagnosis not present

## 2018-07-21 DIAGNOSIS — R69 Illness, unspecified: Secondary | ICD-10-CM | POA: Diagnosis not present

## 2018-07-21 DIAGNOSIS — G9341 Metabolic encephalopathy: Secondary | ICD-10-CM | POA: Diagnosis not present

## 2018-07-21 DIAGNOSIS — D649 Anemia, unspecified: Secondary | ICD-10-CM | POA: Diagnosis not present

## 2018-07-21 DIAGNOSIS — Z9181 History of falling: Secondary | ICD-10-CM | POA: Diagnosis not present

## 2018-07-21 DIAGNOSIS — N401 Enlarged prostate with lower urinary tract symptoms: Secondary | ICD-10-CM | POA: Diagnosis not present

## 2018-07-21 DIAGNOSIS — H409 Unspecified glaucoma: Secondary | ICD-10-CM | POA: Diagnosis not present

## 2018-07-25 DIAGNOSIS — R270 Ataxia, unspecified: Secondary | ICD-10-CM | POA: Diagnosis not present

## 2018-07-25 DIAGNOSIS — N179 Acute kidney failure, unspecified: Secondary | ICD-10-CM | POA: Diagnosis not present

## 2018-07-25 DIAGNOSIS — R5381 Other malaise: Secondary | ICD-10-CM | POA: Diagnosis not present

## 2018-07-25 DIAGNOSIS — K219 Gastro-esophageal reflux disease without esophagitis: Secondary | ICD-10-CM | POA: Diagnosis not present

## 2018-07-25 DIAGNOSIS — I10 Essential (primary) hypertension: Secondary | ICD-10-CM | POA: Diagnosis not present

## 2018-07-25 DIAGNOSIS — R69 Illness, unspecified: Secondary | ICD-10-CM | POA: Diagnosis not present

## 2018-07-25 DIAGNOSIS — R42 Dizziness and giddiness: Secondary | ICD-10-CM | POA: Diagnosis not present

## 2018-07-25 DIAGNOSIS — G4700 Insomnia, unspecified: Secondary | ICD-10-CM | POA: Diagnosis not present

## 2018-07-26 DIAGNOSIS — R69 Illness, unspecified: Secondary | ICD-10-CM | POA: Diagnosis not present

## 2018-07-26 DIAGNOSIS — G9341 Metabolic encephalopathy: Secondary | ICD-10-CM | POA: Diagnosis not present

## 2018-07-26 DIAGNOSIS — M4856XD Collapsed vertebra, not elsewhere classified, lumbar region, subsequent encounter for fracture with routine healing: Secondary | ICD-10-CM | POA: Diagnosis not present

## 2018-08-01 DIAGNOSIS — R300 Dysuria: Secondary | ICD-10-CM | POA: Diagnosis not present

## 2018-08-02 DIAGNOSIS — R300 Dysuria: Secondary | ICD-10-CM | POA: Diagnosis not present

## 2018-08-02 DIAGNOSIS — N401 Enlarged prostate with lower urinary tract symptoms: Secondary | ICD-10-CM | POA: Diagnosis not present

## 2018-08-02 DIAGNOSIS — R339 Retention of urine, unspecified: Secondary | ICD-10-CM | POA: Diagnosis not present

## 2018-08-02 DIAGNOSIS — Z79899 Other long term (current) drug therapy: Secondary | ICD-10-CM | POA: Diagnosis not present

## 2018-08-02 DIAGNOSIS — B3741 Candidal cystitis and urethritis: Secondary | ICD-10-CM | POA: Diagnosis not present

## 2018-08-15 DIAGNOSIS — M4856XD Collapsed vertebra, not elsewhere classified, lumbar region, subsequent encounter for fracture with routine healing: Secondary | ICD-10-CM | POA: Diagnosis not present

## 2018-08-15 DIAGNOSIS — N401 Enlarged prostate with lower urinary tract symptoms: Secondary | ICD-10-CM | POA: Diagnosis not present

## 2018-08-15 DIAGNOSIS — H409 Unspecified glaucoma: Secondary | ICD-10-CM | POA: Diagnosis not present

## 2018-08-15 DIAGNOSIS — M4854XD Collapsed vertebra, not elsewhere classified, thoracic region, subsequent encounter for fracture with routine healing: Secondary | ICD-10-CM | POA: Diagnosis not present

## 2018-08-15 DIAGNOSIS — Z9181 History of falling: Secondary | ICD-10-CM | POA: Diagnosis not present

## 2018-08-15 DIAGNOSIS — R69 Illness, unspecified: Secondary | ICD-10-CM | POA: Diagnosis not present

## 2018-08-15 DIAGNOSIS — D649 Anemia, unspecified: Secondary | ICD-10-CM | POA: Diagnosis not present

## 2018-08-15 DIAGNOSIS — Z86711 Personal history of pulmonary embolism: Secondary | ICD-10-CM | POA: Diagnosis not present

## 2018-08-15 DIAGNOSIS — I10 Essential (primary) hypertension: Secondary | ICD-10-CM | POA: Diagnosis not present

## 2018-08-15 DIAGNOSIS — G9341 Metabolic encephalopathy: Secondary | ICD-10-CM | POA: Diagnosis not present

## 2018-08-24 DIAGNOSIS — K219 Gastro-esophageal reflux disease without esophagitis: Secondary | ICD-10-CM | POA: Diagnosis not present

## 2018-08-24 DIAGNOSIS — R42 Dizziness and giddiness: Secondary | ICD-10-CM | POA: Diagnosis not present

## 2018-08-24 DIAGNOSIS — I10 Essential (primary) hypertension: Secondary | ICD-10-CM | POA: Diagnosis not present

## 2018-08-24 DIAGNOSIS — R69 Illness, unspecified: Secondary | ICD-10-CM | POA: Diagnosis not present

## 2018-08-24 DIAGNOSIS — N179 Acute kidney failure, unspecified: Secondary | ICD-10-CM | POA: Diagnosis not present

## 2018-08-24 DIAGNOSIS — R270 Ataxia, unspecified: Secondary | ICD-10-CM | POA: Diagnosis not present

## 2018-08-24 DIAGNOSIS — G4700 Insomnia, unspecified: Secondary | ICD-10-CM | POA: Diagnosis not present

## 2018-08-24 DIAGNOSIS — R5381 Other malaise: Secondary | ICD-10-CM | POA: Diagnosis not present

## 2018-08-25 DIAGNOSIS — R69 Illness, unspecified: Secondary | ICD-10-CM | POA: Diagnosis not present

## 2018-08-25 DIAGNOSIS — G9341 Metabolic encephalopathy: Secondary | ICD-10-CM | POA: Diagnosis not present

## 2018-08-25 DIAGNOSIS — M4856XD Collapsed vertebra, not elsewhere classified, lumbar region, subsequent encounter for fracture with routine healing: Secondary | ICD-10-CM | POA: Diagnosis not present

## 2018-09-13 DIAGNOSIS — N401 Enlarged prostate with lower urinary tract symptoms: Secondary | ICD-10-CM | POA: Diagnosis not present

## 2018-09-13 DIAGNOSIS — M4854XD Collapsed vertebra, not elsewhere classified, thoracic region, subsequent encounter for fracture with routine healing: Secondary | ICD-10-CM | POA: Diagnosis not present

## 2018-09-13 DIAGNOSIS — R69 Illness, unspecified: Secondary | ICD-10-CM | POA: Diagnosis not present

## 2018-09-13 DIAGNOSIS — I10 Essential (primary) hypertension: Secondary | ICD-10-CM | POA: Diagnosis not present

## 2018-09-13 DIAGNOSIS — D649 Anemia, unspecified: Secondary | ICD-10-CM | POA: Diagnosis not present

## 2018-09-13 DIAGNOSIS — Z86711 Personal history of pulmonary embolism: Secondary | ICD-10-CM | POA: Diagnosis not present

## 2018-09-13 DIAGNOSIS — Z9181 History of falling: Secondary | ICD-10-CM | POA: Diagnosis not present

## 2018-09-13 DIAGNOSIS — H409 Unspecified glaucoma: Secondary | ICD-10-CM | POA: Diagnosis not present

## 2018-09-13 DIAGNOSIS — G9341 Metabolic encephalopathy: Secondary | ICD-10-CM | POA: Diagnosis not present

## 2018-09-13 DIAGNOSIS — M4856XD Collapsed vertebra, not elsewhere classified, lumbar region, subsequent encounter for fracture with routine healing: Secondary | ICD-10-CM | POA: Diagnosis not present

## 2018-09-18 DIAGNOSIS — Z9181 History of falling: Secondary | ICD-10-CM | POA: Diagnosis not present

## 2018-09-18 DIAGNOSIS — Z86711 Personal history of pulmonary embolism: Secondary | ICD-10-CM | POA: Diagnosis not present

## 2018-09-18 DIAGNOSIS — G9341 Metabolic encephalopathy: Secondary | ICD-10-CM | POA: Diagnosis not present

## 2018-09-18 DIAGNOSIS — D649 Anemia, unspecified: Secondary | ICD-10-CM | POA: Diagnosis not present

## 2018-09-18 DIAGNOSIS — H409 Unspecified glaucoma: Secondary | ICD-10-CM | POA: Diagnosis not present

## 2018-09-18 DIAGNOSIS — I10 Essential (primary) hypertension: Secondary | ICD-10-CM | POA: Diagnosis not present

## 2018-09-18 DIAGNOSIS — M4856XD Collapsed vertebra, not elsewhere classified, lumbar region, subsequent encounter for fracture with routine healing: Secondary | ICD-10-CM | POA: Diagnosis not present

## 2018-09-18 DIAGNOSIS — R69 Illness, unspecified: Secondary | ICD-10-CM | POA: Diagnosis not present

## 2018-09-18 DIAGNOSIS — M4854XD Collapsed vertebra, not elsewhere classified, thoracic region, subsequent encounter for fracture with routine healing: Secondary | ICD-10-CM | POA: Diagnosis not present

## 2018-09-18 DIAGNOSIS — N401 Enlarged prostate with lower urinary tract symptoms: Secondary | ICD-10-CM | POA: Diagnosis not present

## 2018-09-24 DIAGNOSIS — N179 Acute kidney failure, unspecified: Secondary | ICD-10-CM | POA: Diagnosis not present

## 2018-09-24 DIAGNOSIS — K219 Gastro-esophageal reflux disease without esophagitis: Secondary | ICD-10-CM | POA: Diagnosis not present

## 2018-09-24 DIAGNOSIS — R42 Dizziness and giddiness: Secondary | ICD-10-CM | POA: Diagnosis not present

## 2018-09-24 DIAGNOSIS — G4700 Insomnia, unspecified: Secondary | ICD-10-CM | POA: Diagnosis not present

## 2018-09-24 DIAGNOSIS — R5381 Other malaise: Secondary | ICD-10-CM | POA: Diagnosis not present

## 2018-09-24 DIAGNOSIS — R69 Illness, unspecified: Secondary | ICD-10-CM | POA: Diagnosis not present

## 2018-09-24 DIAGNOSIS — I10 Essential (primary) hypertension: Secondary | ICD-10-CM | POA: Diagnosis not present

## 2018-09-24 DIAGNOSIS — R270 Ataxia, unspecified: Secondary | ICD-10-CM | POA: Diagnosis not present

## 2018-09-25 DIAGNOSIS — R69 Illness, unspecified: Secondary | ICD-10-CM | POA: Diagnosis not present

## 2018-09-25 DIAGNOSIS — G9341 Metabolic encephalopathy: Secondary | ICD-10-CM | POA: Diagnosis not present

## 2018-09-25 DIAGNOSIS — M4856XD Collapsed vertebra, not elsewhere classified, lumbar region, subsequent encounter for fracture with routine healing: Secondary | ICD-10-CM | POA: Diagnosis not present

## 2018-10-14 DIAGNOSIS — R69 Illness, unspecified: Secondary | ICD-10-CM | POA: Diagnosis not present

## 2018-10-14 DIAGNOSIS — I10 Essential (primary) hypertension: Secondary | ICD-10-CM | POA: Diagnosis not present

## 2018-10-14 DIAGNOSIS — R300 Dysuria: Secondary | ICD-10-CM | POA: Diagnosis not present

## 2018-10-14 DIAGNOSIS — R309 Painful micturition, unspecified: Secondary | ICD-10-CM | POA: Diagnosis not present

## 2018-10-14 DIAGNOSIS — R531 Weakness: Secondary | ICD-10-CM | POA: Diagnosis not present

## 2018-10-14 DIAGNOSIS — N3289 Other specified disorders of bladder: Secondary | ICD-10-CM | POA: Diagnosis not present

## 2018-10-14 DIAGNOSIS — Z79899 Other long term (current) drug therapy: Secondary | ICD-10-CM | POA: Diagnosis not present

## 2018-10-14 DIAGNOSIS — I4891 Unspecified atrial fibrillation: Secondary | ICD-10-CM | POA: Diagnosis not present

## 2018-10-14 DIAGNOSIS — R9349 Abnormal radiologic findings on diagnostic imaging of other urinary organs: Secondary | ICD-10-CM | POA: Diagnosis not present

## 2018-10-15 DIAGNOSIS — N3289 Other specified disorders of bladder: Secondary | ICD-10-CM | POA: Diagnosis not present

## 2018-10-15 DIAGNOSIS — R309 Painful micturition, unspecified: Secondary | ICD-10-CM | POA: Diagnosis not present

## 2018-10-17 DIAGNOSIS — A419 Sepsis, unspecified organism: Secondary | ICD-10-CM | POA: Diagnosis not present

## 2018-10-17 DIAGNOSIS — F0390 Unspecified dementia without behavioral disturbance: Secondary | ICD-10-CM

## 2018-10-17 DIAGNOSIS — R000 Tachycardia, unspecified: Secondary | ICD-10-CM | POA: Diagnosis not present

## 2018-10-17 DIAGNOSIS — R4020 Unspecified coma: Secondary | ICD-10-CM | POA: Diagnosis not present

## 2018-10-17 DIAGNOSIS — Z681 Body mass index (BMI) 19 or less, adult: Secondary | ICD-10-CM | POA: Diagnosis not present

## 2018-10-17 DIAGNOSIS — R69 Illness, unspecified: Secondary | ICD-10-CM | POA: Diagnosis not present

## 2018-10-17 DIAGNOSIS — B9620 Unspecified Escherichia coli [E. coli] as the cause of diseases classified elsewhere: Secondary | ICD-10-CM | POA: Diagnosis not present

## 2018-10-17 DIAGNOSIS — I2584 Coronary atherosclerosis due to calcified coronary lesion: Secondary | ICD-10-CM | POA: Diagnosis not present

## 2018-10-17 DIAGNOSIS — N401 Enlarged prostate with lower urinary tract symptoms: Secondary | ICD-10-CM | POA: Diagnosis not present

## 2018-10-17 DIAGNOSIS — R4182 Altered mental status, unspecified: Secondary | ICD-10-CM | POA: Diagnosis not present

## 2018-10-17 DIAGNOSIS — N390 Urinary tract infection, site not specified: Secondary | ICD-10-CM | POA: Diagnosis not present

## 2018-10-17 DIAGNOSIS — R0902 Hypoxemia: Secondary | ICD-10-CM | POA: Diagnosis not present

## 2018-10-17 DIAGNOSIS — R404 Transient alteration of awareness: Secondary | ICD-10-CM | POA: Diagnosis not present

## 2018-10-17 DIAGNOSIS — B9689 Other specified bacterial agents as the cause of diseases classified elsewhere: Secondary | ICD-10-CM | POA: Diagnosis not present

## 2018-10-17 DIAGNOSIS — G9341 Metabolic encephalopathy: Secondary | ICD-10-CM | POA: Diagnosis not present

## 2018-10-17 DIAGNOSIS — R627 Adult failure to thrive: Secondary | ICD-10-CM

## 2018-10-17 DIAGNOSIS — J9811 Atelectasis: Secondary | ICD-10-CM | POA: Diagnosis not present

## 2018-10-17 DIAGNOSIS — I708 Atherosclerosis of other arteries: Secondary | ICD-10-CM | POA: Diagnosis not present

## 2018-10-17 DIAGNOSIS — R7989 Other specified abnormal findings of blood chemistry: Secondary | ICD-10-CM | POA: Diagnosis not present

## 2018-10-17 DIAGNOSIS — J90 Pleural effusion, not elsewhere classified: Secondary | ICD-10-CM | POA: Diagnosis not present

## 2018-10-17 DIAGNOSIS — E860 Dehydration: Secondary | ICD-10-CM

## 2018-10-17 DIAGNOSIS — E854 Organ-limited amyloidosis: Secondary | ICD-10-CM | POA: Diagnosis not present

## 2018-10-17 DIAGNOSIS — R509 Fever, unspecified: Secondary | ICD-10-CM | POA: Diagnosis not present

## 2018-10-18 DIAGNOSIS — R4182 Altered mental status, unspecified: Secondary | ICD-10-CM | POA: Diagnosis not present

## 2018-10-18 DIAGNOSIS — R69 Illness, unspecified: Secondary | ICD-10-CM | POA: Diagnosis not present

## 2018-10-18 DIAGNOSIS — N390 Urinary tract infection, site not specified: Secondary | ICD-10-CM | POA: Diagnosis not present

## 2018-10-18 DIAGNOSIS — F0390 Unspecified dementia without behavioral disturbance: Secondary | ICD-10-CM | POA: Diagnosis not present

## 2018-10-18 DIAGNOSIS — R509 Fever, unspecified: Secondary | ICD-10-CM | POA: Diagnosis not present

## 2018-10-19 DIAGNOSIS — N390 Urinary tract infection, site not specified: Secondary | ICD-10-CM | POA: Diagnosis not present

## 2018-10-19 DIAGNOSIS — R4182 Altered mental status, unspecified: Secondary | ICD-10-CM | POA: Diagnosis not present

## 2018-10-19 DIAGNOSIS — R69 Illness, unspecified: Secondary | ICD-10-CM | POA: Diagnosis not present

## 2018-10-19 DIAGNOSIS — D649 Anemia, unspecified: Secondary | ICD-10-CM

## 2018-10-19 DIAGNOSIS — R509 Fever, unspecified: Secondary | ICD-10-CM | POA: Diagnosis not present

## 2018-10-19 DIAGNOSIS — I10 Essential (primary) hypertension: Secondary | ICD-10-CM | POA: Diagnosis not present

## 2018-10-19 DIAGNOSIS — Z7401 Bed confinement status: Secondary | ICD-10-CM | POA: Diagnosis not present

## 2018-10-19 DIAGNOSIS — A498 Other bacterial infections of unspecified site: Secondary | ICD-10-CM | POA: Diagnosis not present

## 2018-10-19 DIAGNOSIS — M2550 Pain in unspecified joint: Secondary | ICD-10-CM | POA: Diagnosis not present

## 2018-10-19 DIAGNOSIS — R410 Disorientation, unspecified: Secondary | ICD-10-CM | POA: Diagnosis not present

## 2018-10-19 DIAGNOSIS — E854 Organ-limited amyloidosis: Secondary | ICD-10-CM | POA: Diagnosis not present

## 2018-10-19 DIAGNOSIS — R270 Ataxia, unspecified: Secondary | ICD-10-CM

## 2018-10-21 DIAGNOSIS — R69 Illness, unspecified: Secondary | ICD-10-CM | POA: Diagnosis not present

## 2018-10-21 DIAGNOSIS — B9620 Unspecified Escherichia coli [E. coli] as the cause of diseases classified elsewhere: Secondary | ICD-10-CM | POA: Diagnosis not present

## 2018-10-21 DIAGNOSIS — N400 Enlarged prostate without lower urinary tract symptoms: Secondary | ICD-10-CM | POA: Diagnosis not present

## 2018-10-21 DIAGNOSIS — E559 Vitamin D deficiency, unspecified: Secondary | ICD-10-CM | POA: Diagnosis not present

## 2018-10-22 DIAGNOSIS — B9620 Unspecified Escherichia coli [E. coli] as the cause of diseases classified elsewhere: Secondary | ICD-10-CM | POA: Diagnosis not present

## 2018-10-22 DIAGNOSIS — E559 Vitamin D deficiency, unspecified: Secondary | ICD-10-CM | POA: Diagnosis not present

## 2018-10-22 DIAGNOSIS — N390 Urinary tract infection, site not specified: Secondary | ICD-10-CM | POA: Diagnosis not present

## 2018-10-22 DIAGNOSIS — N400 Enlarged prostate without lower urinary tract symptoms: Secondary | ICD-10-CM | POA: Diagnosis not present

## 2018-10-24 DIAGNOSIS — R69 Illness, unspecified: Secondary | ICD-10-CM | POA: Diagnosis not present

## 2018-10-24 DIAGNOSIS — N179 Acute kidney failure, unspecified: Secondary | ICD-10-CM | POA: Diagnosis not present

## 2018-10-24 DIAGNOSIS — A040 Enteropathogenic Escherichia coli infection: Secondary | ICD-10-CM | POA: Diagnosis not present

## 2018-10-24 DIAGNOSIS — R5381 Other malaise: Secondary | ICD-10-CM | POA: Diagnosis not present

## 2018-10-24 DIAGNOSIS — R569 Unspecified convulsions: Secondary | ICD-10-CM | POA: Diagnosis not present

## 2018-10-24 DIAGNOSIS — E559 Vitamin D deficiency, unspecified: Secondary | ICD-10-CM | POA: Diagnosis not present

## 2018-10-24 DIAGNOSIS — K219 Gastro-esophageal reflux disease without esophagitis: Secondary | ICD-10-CM | POA: Diagnosis not present

## 2018-10-24 DIAGNOSIS — G4700 Insomnia, unspecified: Secondary | ICD-10-CM | POA: Diagnosis not present

## 2018-10-24 DIAGNOSIS — R42 Dizziness and giddiness: Secondary | ICD-10-CM | POA: Diagnosis not present

## 2018-10-24 DIAGNOSIS — I10 Essential (primary) hypertension: Secondary | ICD-10-CM | POA: Diagnosis not present

## 2018-10-24 DIAGNOSIS — D649 Anemia, unspecified: Secondary | ICD-10-CM | POA: Diagnosis not present

## 2018-10-24 DIAGNOSIS — R270 Ataxia, unspecified: Secondary | ICD-10-CM | POA: Diagnosis not present

## 2018-10-25 DIAGNOSIS — M4856XD Collapsed vertebra, not elsewhere classified, lumbar region, subsequent encounter for fracture with routine healing: Secondary | ICD-10-CM | POA: Diagnosis not present

## 2018-10-25 DIAGNOSIS — G9341 Metabolic encephalopathy: Secondary | ICD-10-CM | POA: Diagnosis not present

## 2018-10-25 DIAGNOSIS — R69 Illness, unspecified: Secondary | ICD-10-CM | POA: Diagnosis not present

## 2018-10-29 DIAGNOSIS — B9620 Unspecified Escherichia coli [E. coli] as the cause of diseases classified elsewhere: Secondary | ICD-10-CM | POA: Diagnosis not present

## 2018-10-29 DIAGNOSIS — N400 Enlarged prostate without lower urinary tract symptoms: Secondary | ICD-10-CM | POA: Diagnosis not present

## 2018-10-29 DIAGNOSIS — N390 Urinary tract infection, site not specified: Secondary | ICD-10-CM | POA: Diagnosis not present

## 2018-10-29 DIAGNOSIS — E559 Vitamin D deficiency, unspecified: Secondary | ICD-10-CM | POA: Diagnosis not present

## 2018-11-06 DIAGNOSIS — N400 Enlarged prostate without lower urinary tract symptoms: Secondary | ICD-10-CM | POA: Diagnosis not present

## 2018-11-06 DIAGNOSIS — R69 Illness, unspecified: Secondary | ICD-10-CM | POA: Diagnosis not present

## 2018-11-06 DIAGNOSIS — N390 Urinary tract infection, site not specified: Secondary | ICD-10-CM | POA: Diagnosis not present

## 2018-11-06 DIAGNOSIS — E559 Vitamin D deficiency, unspecified: Secondary | ICD-10-CM | POA: Diagnosis not present

## 2018-11-16 DIAGNOSIS — G301 Alzheimer's disease with late onset: Secondary | ICD-10-CM | POA: Diagnosis not present

## 2018-11-16 DIAGNOSIS — N400 Enlarged prostate without lower urinary tract symptoms: Secondary | ICD-10-CM | POA: Diagnosis not present

## 2018-11-16 DIAGNOSIS — E559 Vitamin D deficiency, unspecified: Secondary | ICD-10-CM | POA: Diagnosis not present

## 2018-11-16 DIAGNOSIS — R69 Illness, unspecified: Secondary | ICD-10-CM | POA: Diagnosis not present

## 2018-11-19 DIAGNOSIS — N400 Enlarged prostate without lower urinary tract symptoms: Secondary | ICD-10-CM | POA: Diagnosis not present

## 2018-11-19 DIAGNOSIS — R69 Illness, unspecified: Secondary | ICD-10-CM | POA: Diagnosis not present

## 2018-11-19 DIAGNOSIS — Z1159 Encounter for screening for other viral diseases: Secondary | ICD-10-CM | POA: Diagnosis not present

## 2018-11-19 DIAGNOSIS — E559 Vitamin D deficiency, unspecified: Secondary | ICD-10-CM | POA: Diagnosis not present

## 2018-11-19 DIAGNOSIS — G301 Alzheimer's disease with late onset: Secondary | ICD-10-CM | POA: Diagnosis not present

## 2018-11-24 DIAGNOSIS — G4700 Insomnia, unspecified: Secondary | ICD-10-CM | POA: Diagnosis not present

## 2018-11-24 DIAGNOSIS — R5381 Other malaise: Secondary | ICD-10-CM | POA: Diagnosis not present

## 2018-11-24 DIAGNOSIS — I10 Essential (primary) hypertension: Secondary | ICD-10-CM | POA: Diagnosis not present

## 2018-11-24 DIAGNOSIS — K219 Gastro-esophageal reflux disease without esophagitis: Secondary | ICD-10-CM | POA: Diagnosis not present

## 2018-11-24 DIAGNOSIS — N179 Acute kidney failure, unspecified: Secondary | ICD-10-CM | POA: Diagnosis not present

## 2018-11-24 DIAGNOSIS — R42 Dizziness and giddiness: Secondary | ICD-10-CM | POA: Diagnosis not present

## 2018-11-24 DIAGNOSIS — R270 Ataxia, unspecified: Secondary | ICD-10-CM | POA: Diagnosis not present

## 2018-11-24 DIAGNOSIS — R69 Illness, unspecified: Secondary | ICD-10-CM | POA: Diagnosis not present

## 2018-11-25 DIAGNOSIS — M4856XD Collapsed vertebra, not elsewhere classified, lumbar region, subsequent encounter for fracture with routine healing: Secondary | ICD-10-CM | POA: Diagnosis not present

## 2018-11-25 DIAGNOSIS — R69 Illness, unspecified: Secondary | ICD-10-CM | POA: Diagnosis not present

## 2018-11-25 DIAGNOSIS — G9341 Metabolic encephalopathy: Secondary | ICD-10-CM | POA: Diagnosis not present

## 2018-12-17 DIAGNOSIS — R69 Illness, unspecified: Secondary | ICD-10-CM | POA: Diagnosis not present

## 2018-12-17 DIAGNOSIS — E559 Vitamin D deficiency, unspecified: Secondary | ICD-10-CM | POA: Diagnosis not present

## 2018-12-17 DIAGNOSIS — G301 Alzheimer's disease with late onset: Secondary | ICD-10-CM | POA: Diagnosis not present

## 2018-12-17 DIAGNOSIS — N400 Enlarged prostate without lower urinary tract symptoms: Secondary | ICD-10-CM | POA: Diagnosis not present

## 2018-12-22 DIAGNOSIS — E559 Vitamin D deficiency, unspecified: Secondary | ICD-10-CM | POA: Diagnosis not present

## 2018-12-22 DIAGNOSIS — J349 Unspecified disorder of nose and nasal sinuses: Secondary | ICD-10-CM | POA: Diagnosis not present

## 2018-12-22 DIAGNOSIS — N400 Enlarged prostate without lower urinary tract symptoms: Secondary | ICD-10-CM | POA: Diagnosis not present

## 2018-12-25 DIAGNOSIS — R270 Ataxia, unspecified: Secondary | ICD-10-CM | POA: Diagnosis not present

## 2018-12-25 DIAGNOSIS — R42 Dizziness and giddiness: Secondary | ICD-10-CM | POA: Diagnosis not present

## 2018-12-25 DIAGNOSIS — G4700 Insomnia, unspecified: Secondary | ICD-10-CM | POA: Diagnosis not present

## 2018-12-25 DIAGNOSIS — K219 Gastro-esophageal reflux disease without esophagitis: Secondary | ICD-10-CM | POA: Diagnosis not present

## 2018-12-25 DIAGNOSIS — R69 Illness, unspecified: Secondary | ICD-10-CM | POA: Diagnosis not present

## 2018-12-25 DIAGNOSIS — R5381 Other malaise: Secondary | ICD-10-CM | POA: Diagnosis not present

## 2018-12-25 DIAGNOSIS — N179 Acute kidney failure, unspecified: Secondary | ICD-10-CM | POA: Diagnosis not present

## 2018-12-25 DIAGNOSIS — I10 Essential (primary) hypertension: Secondary | ICD-10-CM | POA: Diagnosis not present

## 2018-12-26 DIAGNOSIS — M4856XD Collapsed vertebra, not elsewhere classified, lumbar region, subsequent encounter for fracture with routine healing: Secondary | ICD-10-CM | POA: Diagnosis not present

## 2018-12-26 DIAGNOSIS — G9341 Metabolic encephalopathy: Secondary | ICD-10-CM | POA: Diagnosis not present

## 2018-12-26 DIAGNOSIS — R69 Illness, unspecified: Secondary | ICD-10-CM | POA: Diagnosis not present

## 2018-12-29 DIAGNOSIS — R296 Repeated falls: Secondary | ICD-10-CM | POA: Diagnosis not present

## 2018-12-29 DIAGNOSIS — R262 Difficulty in walking, not elsewhere classified: Secondary | ICD-10-CM | POA: Diagnosis not present

## 2019-01-14 DIAGNOSIS — E559 Vitamin D deficiency, unspecified: Secondary | ICD-10-CM | POA: Diagnosis not present

## 2019-01-14 DIAGNOSIS — R262 Difficulty in walking, not elsewhere classified: Secondary | ICD-10-CM | POA: Diagnosis not present

## 2019-01-14 DIAGNOSIS — N400 Enlarged prostate without lower urinary tract symptoms: Secondary | ICD-10-CM | POA: Diagnosis not present

## 2019-01-14 DIAGNOSIS — G301 Alzheimer's disease with late onset: Secondary | ICD-10-CM | POA: Diagnosis not present

## 2019-01-15 DIAGNOSIS — E559 Vitamin D deficiency, unspecified: Secondary | ICD-10-CM | POA: Diagnosis not present

## 2019-01-15 DIAGNOSIS — R05 Cough: Secondary | ICD-10-CM | POA: Diagnosis not present

## 2019-01-15 DIAGNOSIS — D649 Anemia, unspecified: Secondary | ICD-10-CM | POA: Diagnosis not present

## 2019-01-15 DIAGNOSIS — I10 Essential (primary) hypertension: Secondary | ICD-10-CM | POA: Diagnosis not present

## 2019-01-16 DIAGNOSIS — I10 Essential (primary) hypertension: Secondary | ICD-10-CM | POA: Diagnosis not present

## 2019-01-16 DIAGNOSIS — D649 Anemia, unspecified: Secondary | ICD-10-CM | POA: Diagnosis not present

## 2019-01-17 DIAGNOSIS — B9729 Other coronavirus as the cause of diseases classified elsewhere: Secondary | ICD-10-CM | POA: Diagnosis not present

## 2019-01-17 DIAGNOSIS — J156 Pneumonia due to other aerobic Gram-negative bacteria: Secondary | ICD-10-CM | POA: Diagnosis not present

## 2019-01-18 DIAGNOSIS — D649 Anemia, unspecified: Secondary | ICD-10-CM | POA: Diagnosis not present

## 2019-01-18 DIAGNOSIS — I10 Essential (primary) hypertension: Secondary | ICD-10-CM | POA: Diagnosis not present

## 2019-01-19 DIAGNOSIS — N400 Enlarged prostate without lower urinary tract symptoms: Secondary | ICD-10-CM | POA: Diagnosis not present

## 2019-01-19 DIAGNOSIS — R262 Difficulty in walking, not elsewhere classified: Secondary | ICD-10-CM | POA: Diagnosis not present

## 2019-01-19 DIAGNOSIS — E559 Vitamin D deficiency, unspecified: Secondary | ICD-10-CM | POA: Diagnosis not present

## 2019-01-19 DIAGNOSIS — B9729 Other coronavirus as the cause of diseases classified elsewhere: Secondary | ICD-10-CM | POA: Diagnosis not present

## 2019-01-24 DIAGNOSIS — N179 Acute kidney failure, unspecified: Secondary | ICD-10-CM | POA: Diagnosis not present

## 2019-01-24 DIAGNOSIS — R69 Illness, unspecified: Secondary | ICD-10-CM | POA: Diagnosis not present

## 2019-01-24 DIAGNOSIS — G4700 Insomnia, unspecified: Secondary | ICD-10-CM | POA: Diagnosis not present

## 2019-01-24 DIAGNOSIS — R5381 Other malaise: Secondary | ICD-10-CM | POA: Diagnosis not present

## 2019-01-24 DIAGNOSIS — B9729 Other coronavirus as the cause of diseases classified elsewhere: Secondary | ICD-10-CM | POA: Diagnosis not present

## 2019-01-24 DIAGNOSIS — R270 Ataxia, unspecified: Secondary | ICD-10-CM | POA: Diagnosis not present

## 2019-01-24 DIAGNOSIS — R42 Dizziness and giddiness: Secondary | ICD-10-CM | POA: Diagnosis not present

## 2019-01-24 DIAGNOSIS — G301 Alzheimer's disease with late onset: Secondary | ICD-10-CM | POA: Diagnosis not present

## 2019-01-24 DIAGNOSIS — K219 Gastro-esophageal reflux disease without esophagitis: Secondary | ICD-10-CM | POA: Diagnosis not present

## 2019-01-24 DIAGNOSIS — I10 Essential (primary) hypertension: Secondary | ICD-10-CM | POA: Diagnosis not present

## 2019-01-25 DIAGNOSIS — M4856XD Collapsed vertebra, not elsewhere classified, lumbar region, subsequent encounter for fracture with routine healing: Secondary | ICD-10-CM | POA: Diagnosis not present

## 2019-01-25 DIAGNOSIS — G9341 Metabolic encephalopathy: Secondary | ICD-10-CM | POA: Diagnosis not present

## 2019-01-25 DIAGNOSIS — R69 Illness, unspecified: Secondary | ICD-10-CM | POA: Diagnosis not present

## 2019-01-31 DIAGNOSIS — G301 Alzheimer's disease with late onset: Secondary | ICD-10-CM | POA: Diagnosis not present

## 2019-01-31 DIAGNOSIS — R69 Illness, unspecified: Secondary | ICD-10-CM | POA: Diagnosis not present

## 2019-02-11 DIAGNOSIS — N400 Enlarged prostate without lower urinary tract symptoms: Secondary | ICD-10-CM | POA: Diagnosis not present

## 2019-02-11 DIAGNOSIS — E559 Vitamin D deficiency, unspecified: Secondary | ICD-10-CM | POA: Diagnosis not present

## 2019-02-11 DIAGNOSIS — G301 Alzheimer's disease with late onset: Secondary | ICD-10-CM | POA: Diagnosis not present

## 2019-02-11 DIAGNOSIS — B9729 Other coronavirus as the cause of diseases classified elsewhere: Secondary | ICD-10-CM | POA: Diagnosis not present

## 2019-02-11 DIAGNOSIS — Z03818 Encounter for observation for suspected exposure to other biological agents ruled out: Secondary | ICD-10-CM | POA: Diagnosis not present

## 2019-02-20 DIAGNOSIS — Z03818 Encounter for observation for suspected exposure to other biological agents ruled out: Secondary | ICD-10-CM | POA: Diagnosis not present

## 2019-02-24 DIAGNOSIS — R5381 Other malaise: Secondary | ICD-10-CM | POA: Diagnosis not present

## 2019-02-24 DIAGNOSIS — G4700 Insomnia, unspecified: Secondary | ICD-10-CM | POA: Diagnosis not present

## 2019-02-24 DIAGNOSIS — R42 Dizziness and giddiness: Secondary | ICD-10-CM | POA: Diagnosis not present

## 2019-02-24 DIAGNOSIS — I10 Essential (primary) hypertension: Secondary | ICD-10-CM | POA: Diagnosis not present

## 2019-02-24 DIAGNOSIS — K219 Gastro-esophageal reflux disease without esophagitis: Secondary | ICD-10-CM | POA: Diagnosis not present

## 2019-02-24 DIAGNOSIS — R270 Ataxia, unspecified: Secondary | ICD-10-CM | POA: Diagnosis not present

## 2019-02-24 DIAGNOSIS — R69 Illness, unspecified: Secondary | ICD-10-CM | POA: Diagnosis not present

## 2019-02-24 DIAGNOSIS — N179 Acute kidney failure, unspecified: Secondary | ICD-10-CM | POA: Diagnosis not present

## 2019-02-25 DIAGNOSIS — M4856XD Collapsed vertebra, not elsewhere classified, lumbar region, subsequent encounter for fracture with routine healing: Secondary | ICD-10-CM | POA: Diagnosis not present

## 2019-02-25 DIAGNOSIS — R69 Illness, unspecified: Secondary | ICD-10-CM | POA: Diagnosis not present

## 2019-02-25 DIAGNOSIS — Z03818 Encounter for observation for suspected exposure to other biological agents ruled out: Secondary | ICD-10-CM | POA: Diagnosis not present

## 2019-02-25 DIAGNOSIS — G9341 Metabolic encephalopathy: Secondary | ICD-10-CM | POA: Diagnosis not present

## 2019-03-01 DIAGNOSIS — G301 Alzheimer's disease with late onset: Secondary | ICD-10-CM | POA: Diagnosis not present

## 2019-03-01 DIAGNOSIS — R69 Illness, unspecified: Secondary | ICD-10-CM | POA: Diagnosis not present

## 2019-03-03 DIAGNOSIS — R2681 Unsteadiness on feet: Secondary | ICD-10-CM | POA: Diagnosis not present

## 2019-03-03 DIAGNOSIS — R262 Difficulty in walking, not elsewhere classified: Secondary | ICD-10-CM | POA: Diagnosis not present

## 2019-03-03 DIAGNOSIS — N400 Enlarged prostate without lower urinary tract symptoms: Secondary | ICD-10-CM | POA: Diagnosis not present

## 2019-03-03 DIAGNOSIS — E559 Vitamin D deficiency, unspecified: Secondary | ICD-10-CM | POA: Diagnosis not present

## 2019-03-04 DIAGNOSIS — Z03818 Encounter for observation for suspected exposure to other biological agents ruled out: Secondary | ICD-10-CM | POA: Diagnosis not present

## 2019-03-08 DIAGNOSIS — R2681 Unsteadiness on feet: Secondary | ICD-10-CM | POA: Diagnosis not present

## 2019-03-11 DIAGNOSIS — Z03818 Encounter for observation for suspected exposure to other biological agents ruled out: Secondary | ICD-10-CM | POA: Diagnosis not present

## 2019-03-11 DIAGNOSIS — G301 Alzheimer's disease with late onset: Secondary | ICD-10-CM | POA: Diagnosis not present

## 2019-03-11 DIAGNOSIS — N400 Enlarged prostate without lower urinary tract symptoms: Secondary | ICD-10-CM | POA: Diagnosis not present

## 2019-03-11 DIAGNOSIS — R69 Illness, unspecified: Secondary | ICD-10-CM | POA: Diagnosis not present

## 2019-03-11 DIAGNOSIS — E559 Vitamin D deficiency, unspecified: Secondary | ICD-10-CM | POA: Diagnosis not present

## 2019-03-18 DIAGNOSIS — Z03818 Encounter for observation for suspected exposure to other biological agents ruled out: Secondary | ICD-10-CM | POA: Diagnosis not present

## 2019-03-23 DIAGNOSIS — R2681 Unsteadiness on feet: Secondary | ICD-10-CM | POA: Diagnosis not present

## 2019-03-23 DIAGNOSIS — E559 Vitamin D deficiency, unspecified: Secondary | ICD-10-CM | POA: Diagnosis not present

## 2019-03-23 DIAGNOSIS — N400 Enlarged prostate without lower urinary tract symptoms: Secondary | ICD-10-CM | POA: Diagnosis not present

## 2019-03-23 DIAGNOSIS — R262 Difficulty in walking, not elsewhere classified: Secondary | ICD-10-CM | POA: Diagnosis not present

## 2019-03-25 DIAGNOSIS — Z03818 Encounter for observation for suspected exposure to other biological agents ruled out: Secondary | ICD-10-CM | POA: Diagnosis not present

## 2019-03-25 DIAGNOSIS — R69 Illness, unspecified: Secondary | ICD-10-CM | POA: Diagnosis not present

## 2019-03-25 DIAGNOSIS — G301 Alzheimer's disease with late onset: Secondary | ICD-10-CM | POA: Diagnosis not present

## 2019-03-26 DIAGNOSIS — R5381 Other malaise: Secondary | ICD-10-CM | POA: Diagnosis not present

## 2019-03-26 DIAGNOSIS — R270 Ataxia, unspecified: Secondary | ICD-10-CM | POA: Diagnosis not present

## 2019-03-26 DIAGNOSIS — N179 Acute kidney failure, unspecified: Secondary | ICD-10-CM | POA: Diagnosis not present

## 2019-03-26 DIAGNOSIS — G4700 Insomnia, unspecified: Secondary | ICD-10-CM | POA: Diagnosis not present

## 2019-03-26 DIAGNOSIS — R69 Illness, unspecified: Secondary | ICD-10-CM | POA: Diagnosis not present

## 2019-03-26 DIAGNOSIS — I10 Essential (primary) hypertension: Secondary | ICD-10-CM | POA: Diagnosis not present

## 2019-03-26 DIAGNOSIS — R42 Dizziness and giddiness: Secondary | ICD-10-CM | POA: Diagnosis not present

## 2019-03-26 DIAGNOSIS — K219 Gastro-esophageal reflux disease without esophagitis: Secondary | ICD-10-CM | POA: Diagnosis not present

## 2019-03-27 DIAGNOSIS — G9341 Metabolic encephalopathy: Secondary | ICD-10-CM | POA: Diagnosis not present

## 2019-03-27 DIAGNOSIS — R69 Illness, unspecified: Secondary | ICD-10-CM | POA: Diagnosis not present

## 2019-03-27 DIAGNOSIS — M4856XD Collapsed vertebra, not elsewhere classified, lumbar region, subsequent encounter for fracture with routine healing: Secondary | ICD-10-CM | POA: Diagnosis not present

## 2019-04-01 DIAGNOSIS — Z03818 Encounter for observation for suspected exposure to other biological agents ruled out: Secondary | ICD-10-CM | POA: Diagnosis not present

## 2019-04-08 DIAGNOSIS — Z03818 Encounter for observation for suspected exposure to other biological agents ruled out: Secondary | ICD-10-CM | POA: Diagnosis not present

## 2019-04-08 DIAGNOSIS — G301 Alzheimer's disease with late onset: Secondary | ICD-10-CM | POA: Diagnosis not present

## 2019-04-08 DIAGNOSIS — R69 Illness, unspecified: Secondary | ICD-10-CM | POA: Diagnosis not present

## 2019-04-08 DIAGNOSIS — E559 Vitamin D deficiency, unspecified: Secondary | ICD-10-CM | POA: Diagnosis not present

## 2019-04-08 DIAGNOSIS — N400 Enlarged prostate without lower urinary tract symptoms: Secondary | ICD-10-CM | POA: Diagnosis not present

## 2019-04-11 DIAGNOSIS — R2681 Unsteadiness on feet: Secondary | ICD-10-CM | POA: Diagnosis not present

## 2019-04-15 DIAGNOSIS — Z03818 Encounter for observation for suspected exposure to other biological agents ruled out: Secondary | ICD-10-CM | POA: Diagnosis not present

## 2019-04-16 ENCOUNTER — Emergency Department (HOSPITAL_COMMUNITY): Payer: Medicare HMO

## 2019-04-16 ENCOUNTER — Inpatient Hospital Stay (HOSPITAL_COMMUNITY): Payer: Medicare HMO

## 2019-04-16 ENCOUNTER — Encounter (HOSPITAL_COMMUNITY): Payer: Self-pay | Admitting: Family Medicine

## 2019-04-16 ENCOUNTER — Inpatient Hospital Stay (HOSPITAL_COMMUNITY)
Admission: EM | Admit: 2019-04-16 | Discharge: 2019-04-19 | DRG: 871 | Disposition: A | Discharge status: Skilled Nursing Facility | Payer: Medicare HMO | Source: Skilled Nursing Facility | Attending: Family Medicine | Admitting: Family Medicine

## 2019-04-16 DIAGNOSIS — E785 Hyperlipidemia, unspecified: Secondary | ICD-10-CM | POA: Diagnosis present

## 2019-04-16 DIAGNOSIS — R4182 Altered mental status, unspecified: Secondary | ICD-10-CM | POA: Diagnosis not present

## 2019-04-16 DIAGNOSIS — Z681 Body mass index (BMI) 19 or less, adult: Secondary | ICD-10-CM

## 2019-04-16 DIAGNOSIS — R319 Hematuria, unspecified: Secondary | ICD-10-CM | POA: Diagnosis not present

## 2019-04-16 DIAGNOSIS — Z7401 Bed confinement status: Secondary | ICD-10-CM | POA: Diagnosis not present

## 2019-04-16 DIAGNOSIS — R69 Illness, unspecified: Secondary | ICD-10-CM | POA: Diagnosis not present

## 2019-04-16 DIAGNOSIS — R001 Bradycardia, unspecified: Secondary | ICD-10-CM | POA: Diagnosis not present

## 2019-04-16 DIAGNOSIS — Z743 Need for continuous supervision: Secondary | ICD-10-CM | POA: Diagnosis not present

## 2019-04-16 DIAGNOSIS — D649 Anemia, unspecified: Secondary | ICD-10-CM | POA: Diagnosis present

## 2019-04-16 DIAGNOSIS — G301 Alzheimer's disease with late onset: Secondary | ICD-10-CM

## 2019-04-16 DIAGNOSIS — F39 Unspecified mood [affective] disorder: Secondary | ICD-10-CM | POA: Diagnosis present

## 2019-04-16 DIAGNOSIS — F0391 Unspecified dementia with behavioral disturbance: Secondary | ICD-10-CM | POA: Diagnosis present

## 2019-04-16 DIAGNOSIS — Z8249 Family history of ischemic heart disease and other diseases of the circulatory system: Secondary | ICD-10-CM

## 2019-04-16 DIAGNOSIS — A419 Sepsis, unspecified organism: Secondary | ICD-10-CM | POA: Diagnosis not present

## 2019-04-16 DIAGNOSIS — D696 Thrombocytopenia, unspecified: Secondary | ICD-10-CM | POA: Diagnosis present

## 2019-04-16 DIAGNOSIS — F03918 Unspecified dementia, unspecified severity, with other behavioral disturbance: Secondary | ICD-10-CM

## 2019-04-16 DIAGNOSIS — R404 Transient alteration of awareness: Secondary | ICD-10-CM | POA: Diagnosis not present

## 2019-04-16 DIAGNOSIS — R279 Unspecified lack of coordination: Secondary | ICD-10-CM | POA: Diagnosis not present

## 2019-04-16 DIAGNOSIS — N390 Urinary tract infection, site not specified: Secondary | ICD-10-CM | POA: Diagnosis present

## 2019-04-16 DIAGNOSIS — Z8616 Personal history of COVID-19: Secondary | ICD-10-CM | POA: Diagnosis not present

## 2019-04-16 DIAGNOSIS — R509 Fever, unspecified: Secondary | ICD-10-CM | POA: Diagnosis not present

## 2019-04-16 DIAGNOSIS — Z8042 Family history of malignant neoplasm of prostate: Secondary | ICD-10-CM

## 2019-04-16 DIAGNOSIS — Z20822 Contact with and (suspected) exposure to covid-19: Secondary | ICD-10-CM | POA: Diagnosis not present

## 2019-04-16 DIAGNOSIS — N400 Enlarged prostate without lower urinary tract symptoms: Secondary | ICD-10-CM | POA: Diagnosis present

## 2019-04-16 DIAGNOSIS — I680 Cerebral amyloid angiopathy: Secondary | ICD-10-CM | POA: Diagnosis present

## 2019-04-16 DIAGNOSIS — F0151 Vascular dementia with behavioral disturbance: Secondary | ICD-10-CM | POA: Diagnosis not present

## 2019-04-16 DIAGNOSIS — Z79899 Other long term (current) drug therapy: Secondary | ICD-10-CM | POA: Diagnosis not present

## 2019-04-16 DIAGNOSIS — I959 Hypotension, unspecified: Secondary | ICD-10-CM | POA: Diagnosis not present

## 2019-04-16 DIAGNOSIS — E43 Unspecified severe protein-calorie malnutrition: Secondary | ICD-10-CM | POA: Diagnosis not present

## 2019-04-16 DIAGNOSIS — I252 Old myocardial infarction: Secondary | ICD-10-CM | POA: Diagnosis not present

## 2019-04-16 DIAGNOSIS — Z950 Presence of cardiac pacemaker: Secondary | ICD-10-CM | POA: Diagnosis not present

## 2019-04-16 DIAGNOSIS — I10 Essential (primary) hypertension: Secondary | ICD-10-CM | POA: Diagnosis present

## 2019-04-16 DIAGNOSIS — Z993 Dependence on wheelchair: Secondary | ICD-10-CM

## 2019-04-16 DIAGNOSIS — E854 Organ-limited amyloidosis: Secondary | ICD-10-CM | POA: Diagnosis present

## 2019-04-16 DIAGNOSIS — G4700 Insomnia, unspecified: Secondary | ICD-10-CM | POA: Diagnosis present

## 2019-04-16 DIAGNOSIS — N323 Diverticulum of bladder: Secondary | ICD-10-CM | POA: Diagnosis present

## 2019-04-16 DIAGNOSIS — R0902 Hypoxemia: Secondary | ICD-10-CM | POA: Diagnosis not present

## 2019-04-16 DIAGNOSIS — F0280 Dementia in other diseases classified elsewhere without behavioral disturbance: Secondary | ICD-10-CM | POA: Diagnosis not present

## 2019-04-16 HISTORY — DX: Benign prostatic hyperplasia without lower urinary tract symptoms: N40.0

## 2019-04-16 HISTORY — DX: Dementia in other diseases classified elsewhere, unspecified severity, without behavioral disturbance, psychotic disturbance, mood disturbance, and anxiety: F02.80

## 2019-04-16 LAB — URINALYSIS, ROUTINE W REFLEX MICROSCOPIC
Bilirubin Urine: NEGATIVE
Glucose, UA: NEGATIVE mg/dL
Ketones, ur: 5 mg/dL — AB
Nitrite: POSITIVE — AB
Protein, ur: 100 mg/dL — AB
Specific Gravity, Urine: 1.019 (ref 1.005–1.030)
WBC, UA: >50 WBC/hpf — ABNORMAL HIGH (ref 0–5)
pH: 6 (ref 5.0–8.0)

## 2019-04-16 LAB — COMPREHENSIVE METABOLIC PANEL
ALT: 9 U/L (ref 0–44)
AST: 14 U/L — ABNORMAL LOW (ref 15–41)
Albumin: 2.6 g/dL — ABNORMAL LOW (ref 3.5–5.0)
Alkaline Phosphatase: 40 U/L (ref 38–126)
Anion gap: 6 (ref 5–15)
BUN: 21 mg/dL (ref 8–23)
CO2: 26 mmol/L (ref 22–32)
Calcium: 8.1 mg/dL — ABNORMAL LOW (ref 8.9–10.3)
Chloride: 111 mmol/L (ref 98–111)
Creatinine, Ser: 1.12 mg/dL (ref 0.61–1.24)
GFR calc Af Amer: >60 mL/min (ref >60)
GFR calc non Af Amer: 60 mL/min — ABNORMAL LOW (ref >60)
Glucose, Bld: 112 mg/dL — ABNORMAL HIGH (ref 70–99)
Potassium: 4.2 mmol/L (ref 3.5–5.1)
Sodium: 143 mmol/L (ref 135–145)
Total Bilirubin: 0.4 mg/dL (ref 0.3–1.2)
Total Protein: 5.4 g/dL — ABNORMAL LOW (ref 6.5–8.1)

## 2019-04-16 LAB — CBC WITH DIFFERENTIAL/PLATELET
Abs Immature Granulocytes: 0.1 10*3/uL — ABNORMAL HIGH (ref 0.00–0.07)
Basophils Absolute: 0.1 10*3/uL (ref 0.0–0.1)
Basophils Relative: 0 %
Eosinophils Absolute: 0 10*3/uL (ref 0.0–0.5)
Eosinophils Relative: 0 %
HCT: 36.7 % — ABNORMAL LOW (ref 39.0–52.0)
Hemoglobin: 11.6 g/dL — ABNORMAL LOW (ref 13.0–17.0)
Immature Granulocytes: 1 %
Lymphocytes Relative: 3 %
Lymphs Abs: 0.4 10*3/uL — ABNORMAL LOW (ref 0.7–4.0)
MCH: 30 pg (ref 26.0–34.0)
MCHC: 31.6 g/dL (ref 30.0–36.0)
MCV: 94.8 fL (ref 80.0–100.0)
Monocytes Absolute: 1.6 10*3/uL — ABNORMAL HIGH (ref 0.1–1.0)
Monocytes Relative: 14 %
Neutro Abs: 9.1 10*3/uL — ABNORMAL HIGH (ref 1.7–7.7)
Neutrophils Relative %: 82 %
Platelets: 134 10*3/uL — ABNORMAL LOW (ref 150–400)
RBC: 3.87 MIL/uL — ABNORMAL LOW (ref 4.22–5.81)
RDW: 17.5 % — ABNORMAL HIGH (ref 11.5–15.5)
WBC: 11.2 10*3/uL — ABNORMAL HIGH (ref 4.0–10.5)
nRBC: 0 % (ref 0.0–0.2)

## 2019-04-16 LAB — LACTIC ACID, PLASMA
Lactic Acid, Venous: 1.7 mmol/L (ref 0.5–1.9)
Lactic Acid, Venous: 1.6 mmol/L (ref 0.5–1.9)

## 2019-04-16 LAB — RESPIRATORY PANEL BY RT PCR (FLU A&B, COVID)
Influenza A by PCR: NEGATIVE
Influenza B by PCR: NEGATIVE
SARS Coronavirus 2 by RT PCR: NEGATIVE

## 2019-04-16 LAB — TROPONIN I (HIGH SENSITIVITY): Troponin I (High Sensitivity): 14 ng/L (ref <18)

## 2019-04-16 LAB — POC SARS CORONAVIRUS 2 AG -  ED: SARS Coronavirus 2 Ag: NEGATIVE

## 2019-04-16 LAB — LIPASE, BLOOD: Lipase: 15 U/L (ref 11–51)

## 2019-04-16 MED ORDER — SODIUM CHLORIDE 0.9 % IV SOLN
1.0000 g | INTRAVENOUS | Status: DC
  Administered 2019-04-17: 1 g via INTRAVENOUS
  Filled 2019-04-16: qty 10

## 2019-04-16 MED ORDER — ACETAMINOPHEN 325 MG PO TABS
650.0000 mg | ORAL_TABLET | Freq: Four times a day (QID) | ORAL | Status: DC | PRN
  Administered 2019-04-18: 650 mg via ORAL
  Filled 2019-04-16: qty 2

## 2019-04-16 MED ORDER — ACETAMINOPHEN 650 MG RE SUPP
650.0000 mg | Freq: Once | RECTAL | Status: AC
  Administered 2019-04-16: 650 mg via RECTAL
  Filled 2019-04-16: qty 1

## 2019-04-16 MED ORDER — SODIUM CHLORIDE 0.9 % IV BOLUS
1000.0000 mL | Freq: Once | INTRAVENOUS | Status: AC
  Administered 2019-04-16: 1000 mL via INTRAVENOUS

## 2019-04-16 MED ORDER — SODIUM CHLORIDE 0.9 % IV SOLN
1.0000 g | Freq: Once | INTRAVENOUS | Status: AC
  Administered 2019-04-16: 1 g via INTRAVENOUS
  Filled 2019-04-16: qty 10

## 2019-04-16 MED ORDER — SODIUM CHLORIDE 0.9 % IV SOLN
500.0000 mg | Freq: Once | INTRAVENOUS | Status: AC
  Administered 2019-04-16: 500 mg via INTRAVENOUS
  Filled 2019-04-16: qty 500

## 2019-04-16 MED ORDER — ACETAMINOPHEN 650 MG RE SUPP
650.0000 mg | Freq: Four times a day (QID) | RECTAL | Status: DC | PRN
  Administered 2019-04-16: 650 mg via RECTAL
  Filled 2019-04-16: qty 1

## 2019-04-16 MED ORDER — SODIUM CHLORIDE 0.9 % IV SOLN: INTRAVENOUS | Status: DC

## 2019-04-16 MED ORDER — ENOXAPARIN SODIUM 40 MG/0.4ML ~~LOC~~ SOLN
40.0000 mg | SUBCUTANEOUS | Status: DC
  Administered 2019-04-16 – 2019-04-19 (×4): 40 mg via SUBCUTANEOUS
  Filled 2019-04-16 (×4): qty 0.4

## 2019-04-16 NOTE — ED Notes (Signed)
Pt's temp taken- 98.6. Pt given warm blankets, head of bed lowered for comfort.

## 2019-04-16 NOTE — ED Notes (Signed)
Attempted to reach [facility] to update patient

## 2019-04-16 NOTE — ED Provider Notes (Addendum)
[facility] EMERGENCY DEPARTMENT Provider Note   CSN: [id number] Arrival date & time: 04/16/19  1235     History Chief Complaint  Patient presents with  . Altered Mental Status    Alejandro Long is a 86 y.o. male.  LEVEL 5 caveat due to dementia with worsening mental status. Patient bedbound, demented with ability to sometimes speak. Patient with increased confusion this morning. Patient febrile with EMS and hypotensive and given fluids. Patient got covid vaccine yesterday. Patient had COVID infection last year. History obtained from EMS and nursing home. Patient appeared to be shaking all over when EMS arrive and patient very warm to touch. No nausea or vomting.         No past medical history on file.  There are no problems to display for this patient.     No family history on file.  Social History   Tobacco Use  . Smoking status: Not on file  Substance Use Topics  . Alcohol use: Not on file  . Drug use: Not on file    Home Medications Prior to Admission medications   Medication Sig Start Date End Date Taking? Authorizing Provider  acetaminophen (TYLENOL) 325 MG tablet Take 650 mg by mouth every 6 (six) hours as needed for mild pain or fever.   Yes [provider]  albuterol (ACCUNEB) 1.25 MG/3ML nebulizer solution Take 1 ampule by nebulization every 6 (six) hours as needed for wheezing.   Yes [provider]  bethanechol (URECHOLINE) 25 MG tablet Take 25 mg by mouth 2 (two) times daily.   Yes [provider]  divalproex (DEPAKOTE SPRINKLE) 125 MG capsule Take 250-500 mg by mouth See admin instructions. Take 250 mg by mouth in afternoon, then 500 mg at bedtime.   Yes [provider]  donepezil (ARICEPT) 10 MG tablet Take 10 mg by mouth at bedtime.   Yes [provider]  Melatonin 5 MG TABS Take 5 mg by mouth at bedtime.   Yes [provider]    Allergies    Patient has no known  allergies.  Review of Systems   Review of Systems  Unable to perform ROS: Dementia    Physical Exam Updated Vital Signs  ED Triage Vitals [04/16/19 1300]  Enc Vitals Group     BP 104/76     Pulse Rate 79     Resp (!) 25     Temp (!) 102.3 F (39.1 C)     Temp Source Rectal     SpO2 100 %     Weight      Height      Head Circumference      Peak Flow      Pain Score      Pain Loc      Pain Edu?      Excl. in [place]?     Physical Exam Vitals and nursing note reviewed.  Constitutional:      General: He is in acute distress.     Appearance: He is well-developed. He is ill-appearing.  HENT:     Head: Normocephalic and atraumatic.     Nose: Nose normal.  Eyes:     Conjunctiva/sclera: Conjunctivae normal.     Pupils: Pupils are equal, round, and reactive to light.  Cardiovascular:     Rate and Rhythm: Normal rate and regular rhythm.     Pulses: Normal pulses.     Heart sounds: Normal heart sounds. No murmur.  Pulmonary:  Effort: Pulmonary effort is normal. No respiratory distress.     Breath sounds: Normal breath sounds.  Abdominal:     General: There is no distension.     Palpations: Abdomen is soft.     Tenderness: There is no abdominal tenderness.  Musculoskeletal:        General: No swelling.     Cervical back: Neck supple.  Skin:    General: Skin is warm and dry.     Comments: Warm to touch  Neurological:     General: No focal deficit present.     Comments: Contractures of all extremities, small tremors in arms     ED Results / Procedures / Treatments   Labs (all labs ordered are listed, but only abnormal results are displayed) Labs Reviewed  COMPREHENSIVE METABOLIC PANEL - Abnormal; Notable for the following components:      Result Value   Glucose, Bld 112 (*)    Calcium 8.1 (*)    Total Protein 5.4 (*)    Albumin 2.6 (*)    AST 14 (*)    GFR calc non Af Amer 60 (*)    All other components within normal limits  CBC WITH DIFFERENTIAL/PLATELET -  Abnormal; Notable for the following components:   WBC 11.2 (*)    RBC 3.87 (*)    Hemoglobin 11.6 (*)    HCT 36.7 (*)    RDW 17.5 (*)    Platelets 134 (*)    Neutro Abs 9.1 (*)    Lymphs Abs 0.4 (*)    Monocytes Absolute 1.6 (*)    Abs Immature Granulocytes 0.10 (*)    All other components within normal limits  URINALYSIS, ROUTINE W REFLEX MICROSCOPIC - Abnormal; Notable for the following components:   APPearance CLOUDY (*)    Hgb urine dipstick MODERATE (*)    Ketones, ur 5 (*)    Protein, ur 100 (*)    Nitrite POSITIVE (*)    Leukocytes,Ua LARGE (*)    WBC, UA >50 (*)    Bacteria, UA MANY (*)    All other components within normal limits  CULTURE, BLOOD (ROUTINE X 2)  CULTURE, BLOOD (ROUTINE X 2)  URINE CULTURE  RESPIRATORY PANEL BY RT PCR (FLU A&B, COVID)  LACTIC ACID, PLASMA  LIPASE, BLOOD  LACTIC ACID, PLASMA  PROTIME-INR  APTT  POC SARS CORONAVIRUS 2 AG -  ED    EKG EKG Interpretation  Date/Time:  Tuesday April 16 2019 12:56:41 EST Ventricular Rate:  76 PR Interval:    QRS Duration: 119 QT Interval:  365 QTC Calculation: 411 R Axis:   19 Text Interpretation: Sinus rhythm Nonspecific intraventricular conduction delay Low voltage, precordial leads Abnormal T, consider ischemia, lateral leads Confirmed by Lennice Sites [phone]) on 04/16/2019 1:27:15 PM   Radiology DG Chest Port 1 View  Result Date: 04/16/2019 CLINICAL DATA:  Fever for 1 day. Altered mental status. EXAM: PORTABLE CHEST 1 VIEW COMPARISON:  None. FINDINGS: 1412 hours. The heart size is at the upper limits of normal for portable semi erect technique. There is a moderate size hiatal hernia and mild aortic atherosclerosis. There are low lung volumes with probable bibasilar atelectasis. No edema, confluent airspace opacity, pneumothorax or significant pleural effusion. Old rib fractures are present on the left. Evidence of a chronic right-sided rotator cuff tear. Telemetry leads overlie the chest.  IMPRESSION: 1. Low lung volumes with probable bibasilar atelectasis. No focal airspace disease or edema. 2. Moderate-sized hiatal hernia. Electronically Signed  By: Richardean Sale M.D.   On: 04/16/2019 14:25    Procedures .Critical Care Performed by: Lennice Sites, DO Authorized by: Lennice Sites, DO   Critical care provider statement:    Critical care time (minutes):  35   Critical care was necessary to treat or prevent imminent or life-threatening deterioration of the following conditions:  Sepsis   Critical care was time spent personally by me on the following activities:  Blood draw for specimens, development of treatment plan with patient or surrogate, discussions with primary provider, evaluation of patient's response to treatment, examination of patient, ordering and performing treatments and interventions, ordering and review of radiographic studies, ordering and review of laboratory studies, pulse oximetry, re-evaluation of patient's condition and obtaining history from patient or surrogate   (including critical care time)  Medications Ordered in ED Medications  sodium chloride 0.9 % bolus 1,000 mL (0 mLs Intravenous Stopped 04/16/19 1452)  acetaminophen (TYLENOL) suppository 650 mg (650 mg Rectal Given 04/16/19 1315)  azithromycin (ZITHROMAX) 500 mg in sodium chloride 0.9 % 250 mL IVPB (0 mg Intravenous Stopped 04/16/19 1452)  cefTRIAXone (ROCEPHIN) 1 g in sodium chloride 0.9 % 100 mL IVPB (0 g Intravenous Stopped 04/16/19 1403)  sodium chloride 0.9 % bolus 1,000 mL (1,000 mLs Intravenous New Bag/Given 04/16/19 1452)    ED Course  I have reviewed the triage vital signs and the nursing notes.  Pertinent labs & imaging results that were available during my care of the patient were reviewed by me and considered in my medical decision making (see chart for details).    MDM Rules/Calculators/A&P  Alejandro Long is an 86 year old male with history of dementia who presents to the ED  with fever, hypotension.  Concern for sepsis.  Did have coronavirus last year.  Got his first Covid vaccination yesterday.  Patient was hypotensive with EMS but is normotensive upon arrival.  Will give 2 L of IV fluids.  Empirically given a Zithromax and Rocephin.  Overall patient has severe dementia.  Sometimes he is able to have conversations.  He had new onset of tremors and altered mental status this morning.  He appears very warm to the touch.  Patient is febrile and tachypneic upon arrival.  Patient is still a full code according to power of attorney.  Patient with possibly infectious process secondary to lung infection or urine infection.  However, symptoms could be secondary to Covid vaccination.  Chest x-ray shows no obvious infectious findings.  Patient does have mild leukocytosis.  However lactic acid is normal.  No significant electrolyte abnormality, kidney injury.  Rapid Covid test is negative.  Urinalysis is pending.  Will order confirmatory Covid testing.  Mentation appears to have improved following Tylenol and fluid bolus.  Will admit for further sepsis care.  Patient appears to have urinary tract infection.  Likely source for fever and mental status change today.  Admitting team requests head CT which I think is reasonable.  Patient did not have fall per facility.  Hemodynamics have improved following IV fluids and antibiotics.   Alejandro Long was evaluated in Emergency Department on 04/16/2019 for the symptoms described in the history of present illness. He was evaluated in the context of the global COVID-19 pandemic, which necessitated consideration that the patient might be at risk for infection with the SARS-CoV-2 virus that causes COVID-19. Institutional protocols and algorithms that pertain to the evaluation of patients at risk for COVID-19 are in a state of rapid change based on information released by  regulatory bodies including the CDC and federal and state organizations. These policies  and algorithms were followed during the patient's care in the ED.   Final Clinical Impression(s) / ED Diagnoses Final diagnoses:  Altered mental status, unspecified altered mental status type  Sepsis, due to unspecified organism, unspecified whether acute organ dysfunction present [facility])  Urinary tract infection without hematuria, site unspecified    Rx / DC Orders ED Discharge Orders    None       Lennice Sites, DO 04/16/19 Mathews, [place], DO 04/16/19 1618

## 2019-04-16 NOTE — Discharge Summary (Signed)
[facility] Discharge Summary  Patient name: Alejandro Long Medical record number: [id number] Date of birth: [birth year] Age: 86 y.o. 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 Soft, Nontender, Nondistended MSK: no lower extremity edema or tenderness SKIN: warm, dry NEURO: oriented to self (baseline)    Brief Hospital Course:   Altered mental status 2/2 urosepsis: Initially presented on 1/12 with altered mental status from baseline with rigors and additionally found to be febrile to 102F.  CT head without acute intracranial abnormality.  CXR without opacities.  Urinalysis suggestive of infection with positive LE, nitrates, many bacteria and urine culture growing over 100,000 colonies of ESBL E. coli.  Blood cultures were obtained and showed coagulase negative Staphylococcus, suspected to be a contaminant.  He was started on IV ceftriaxone and transitioned to oral Bactrim on 1/14 after culture sensitivities resulted.  To evaluate for any underlying urinary obstruction given pyelonephritis, a  renal U/S was obtained showing no hydronephrosis with normal bladder distention. He will continue on Bactrim for a total of 14-day course, to finish on 05/01/2019.  At discharge, he was hemodynamically stable, afebrile >24 hours, and back to his mental baseline.   Issues for Follow Up:  1. E. coli UTI sent home on Bactrim, ensure he receives this through 05/01/2019.  2. Please consider continued evaluation of bladder function, during hospitalization his bladder scans were consistently <100 mL while not on bethanecol. Reassess bethanecol and continue as appropriate.   3. Discontinued Donzepil and Depakote given advanced dementia, unlikely to benefit at this time.  However please evaluate and restart if clinically indicated. 4.  Due to his severe malnutrition related to his dementia, nutrition consult during his admission recommended adding Magic cups 3 times daily with meals and multivitamin daily, please add this if available at his nursing facility.   Significant Procedures: None  Significant Labs and Imaging:  Recent Labs  Lab 04/17/19 0525 04/18/19 0341 04/19/19 0400  WBC 14.0* 9.4 4.9  HGB 10.8* 10.9* 10.5*  HCT 33.4* 34.3* 31.9*  PLT 110* 103* 122*   Recent Labs  Lab 04/16/19 1351 04/16/19 1351 04/17/19 0525 04/17/19 0525 04/18/19 0341 04/18/19 1441 04/19/19 0400  NA 143  --  143  --  145  --  143  K 4.2   < > 4.8   < > 3.5  --  3.3*  CL 111  --  112*  --  112*  --  112*  CO2 26  --  23  --  23  --  23  GLUCOSE 112*  --  89  --  65* 97 83  BUN 21  --  21  --  22  --  19  CREATININE 1.12  --  1.08  --  1.18  --  1.04  CALCIUM 8.1*  --  7.6*  --  8.0*  --  8.2*  ALKPHOS 40  --   --   --   --   --   --   AST 14*  --   --   --   --   --   --   ALT 9  --   --   --   --   --   --   ALBUMIN 2.6*  --   --   --   --   --   --    < > = values in this interval not displayed.     Results/Tests Pending at Time of Discharge: None  Discharge Medications:  Allergies as of  04/19/2019   No Known Allergies     Medication List    STOP taking these medications   divalproex 125 MG capsule Commonly known as: DEPAKOTE SPRINKLE   donepezil 10 MG tablet Commonly known as: ARICEPT     TAKE these medications   acetaminophen 325 MG tablet Commonly known as: TYLENOL Take 650 mg by mouth every 6 (six) hours as needed for mild pain or fever.   albuterol 1.25 MG/3ML nebulizer solution Commonly known as: ACCUNEB Take 1 ampule by nebulization every 6 (six) hours as needed for wheezing.   bethanechol 25 MG tablet Commonly known as: URECHOLINE Take 25 mg by mouth 2 (two) times daily.   Melatonin 5 MG Tabs Take 5 mg by mouth at bedtime.   sulfamethoxazole-trimethoprim 800-160 MG tablet Commonly known as: BACTRIM DS Take 1 tablet by mouth every 12 (twelve) hours for 12 days.       Discharge Instructions: Please refer to Patient Instructions section of EMR for full details.  Patient was counseled important signs and symptoms that should prompt return to medical care, changes in medications, dietary instructions, activity restrictions, and follow up appointments.   Follow-Up Appointments:   Lyndee Hensen, DO 04/19/2019, 12:43 PM PGY-1, [place]

## 2019-04-16 NOTE — H&P (Addendum)
[facility] Admission History and Physical Service Pager: [phone]  Patient name: Alejandro Long Medical record number: [id number] Date of birth: Aug 16, 1932 Age: 86 y.o. Gender: male  Primary Care Provider: Patient, No Pcp Per Consultants: None  Code Status: Full (Discused w/ POA on Admission) Preferred Emergency Contact:   Court Appointed POA: Taylor [place]. [phone]. Alt. [phone]. PCP: Dr. Celedonio Miyamoto. [facility]. [id number] SNF: [facility]. [phone]   Chief Complaint: confusion    Assessment and Plan: Alejandro Long is a 86 y.o. male presenting with worsening altered mental status . PMH is significant for hx MI with pacemaker (currently not evidenced on admission), HTN, Organ amyloidosis, BHP, HLD, Insomnia, Anemia   Altered mental status likely secondary to Urosepsis Patient with history of extensive dementia and oriented to self at baseline presented from [facility] after becoming "unresponsive," satting 89% on room air, BP 87/53 with new onset tremors.  EMS was called and patient found to be febrile and hypotensive 80s over 50s.  On arrival to the ED, febrile 102.3 F, hypotensive with rigors. Code sepsis initiated. Patient given 2x 1L NS bolus, started on CTX and Azithromycin. Follow up blood and urine cultures. BP normalized after fluid bolus. On admission, he is awake and shaking. No focal neurodeficit, but does not follow commands or answer questions. Mild leukocytosis with left shift (WBC 11.2).  Lactic acid 1.7. UA with positive nitrite and leukocyte esterase with evidence of pyuria. Patient had COVID >90d ago and received Covid vaccine yesterday. Fever and rigors could be related to Covid vaccine reaction,  . Influenza and Covid testing was negative. Rigors likely due to urosepsis however tremors and contractions could be due to seizure activity. Patient taking Depakote presumably for mood  stabilization related to dementia. Consider neurology consult for EEG if patient declines and tremors continue. Intracranial hemorrhage not likely as SNF did not report fall and CT Head did not indicate acute intracranial abnormality. As staff reported patient was unresponsive for a short time and has history of MI, his Troponin 14. EKG SR to gross ST elevation. No priors to compare. MI unlikely at this time. Abdomen exam mildly tender to Dr. Owens Shark. Will monitor - Admit to Medical Telemetry, Attending Dr. Owens Shark  - Frequent neuro checks  - CTX (1/12 - )  - Azithromycin (1/12)  - mIVF at 100 mL/hr  - Tylenol 650 mg PRN  - Monitor fever curve  - Continuous cardiac monitoring  - Continuous pulse oxymetry  - Vitals Q1H - Follow up BCx and UCx  - Follow up on AM CBC, BMP, Procalcitonin  - Consult PT / OT / SLP  - NPO while altered - monitor abdominal exam, low threshold to obtain abdominal imaging - seizure precautions - trend CBC   Protein Calorie Malnutrition  Total protein 5.4, albumin 2.6 on admission.  - Consider nutrition consult in AM  HTN  Hx of MI w/ Pacemarker   HLD  No hypertensive, or lipid altering medications listed on intake forms. As staff reported patient was unresponsive hsTrop 14.   - Monitor BP   Normocytic Anemia  Hgb on admission 11.6, MCV 94.8.   - monitor with CBC   BPH  hx of bladder diverticulum Home medications include bethanechol 25 mg twice daily. Bladder scan with less than 200 -Hold home medications while NPO - monitor I/Os  Dementia with mood disorder  Cerebral Amyloid Angiopathy  Medications include Depakote sprinkles 250 mg in the afternoon  and 500 mg at bedtime, Donepezil 10 mg at bedtime.  - Consider delirium precautions  - Hold while NPO   Insomnia  Home medications include 5 mg Melatonin at bedtime.  - Hold home medications while NPO   FEN/GI: NPO Prophylaxis: Lovenox  Disposition: Admit to medical telemetry   History of Present  Illness:   History provided by ED physician and [facility] staff  Alejandro Long is a 86 y.o. male presenting with worsening altered mental status.  Patient has dementia.  Per [facility] staff patient is alert and oriented to self only at baseline.  Occasionally answers questions.  Can feed himself however he did not feed himself today. Patient is wheel chair bound. Around lunchtime he began having tremors. His blood pressure at that time was 87/53 satting at 89%.  Staff reported patient became unresponsive and they called EMS.  He is wheelchair-bound.  Had Covid greater than 90 days ago and had the Covid vaccine yesterday.   In ED, patient given 2L ED for hypotension, was febrile and started on CTX and Azithromycin. Covid testing was negative. CXR without evidence of pneumonia. UA concerning for infection.  Patient admitted for concerns for urosepsis.   Review Of Systems: Per HPI with the following additions: Review of Systems  Unable to perform ROS: Dementia    Patient Active Problem List   Diagnosis Date Noted  . Sepsis ([place]) 04/16/2019    Past Medical History: No past medical history on file.  Essential HTN, prior MI, has pacemaker, BPH, Insomnia, Anemia   Past Surgical History: Cholecystectomy, tonsillectomy, adenoidectomy.  Listed on SNF intake paperwork.  Social History: Social History   Tobacco Use  . Smoking status: Not on file  Substance Use Topics  . Alcohol use: Not on file  . Drug use: Not on file   Additional social history: Patient altered and unable to answer questions. Please also refer to relevant sections of EMR.  Family History:  Patient altered and unable to answer questions.  Family history of hypertension and prostate cancer listed on SNF intake paperwork.   Allergies and Medications: No Known Allergies No current facility-administered medications on file prior to encounter.   Current Outpatient Medications on File Prior to Encounter    Medication Sig Dispense Refill  . acetaminophen (TYLENOL) 325 MG tablet Take 650 mg by mouth every 6 (six) hours as needed for mild pain or fever.    Marland Kitchen albuterol (ACCUNEB) 1.25 MG/3ML nebulizer solution Take 1 ampule by nebulization every 6 (six) hours as needed for wheezing.    . bethanechol (URECHOLINE) 25 MG tablet Take 25 mg by mouth 2 (two) times daily.    . divalproex (DEPAKOTE SPRINKLE) 125 MG capsule Take 250-500 mg by mouth See admin instructions. Take 250 mg by mouth in afternoon, then 500 mg at bedtime.    . donepezil (ARICEPT) 10 MG tablet Take 10 mg by mouth at bedtime.    . Melatonin 5 MG TABS Take 5 mg by mouth at bedtime.      Objective: BP 100/81   Pulse 78   Temp 99.1 F (37.3 C) (Axillary)   Resp (!) 27   SpO2 97%   Exam:  GEN:     lethargic, frail, elderly male, in no acute distress   HENT:  mucus membranes moist, nares patent, no nasal discharge  EYES:   pupils equal and reactive, sclera white NECK:  supple, no lymphadenopathy  RESP:  clear to auscultation bilaterally, no increased work of  breathing  CVS:   regular rate and rhythm, no murmur appreciated, distal pulses intact  ABD:  soft, non-tender; bowel sounds present; no palpable masses EXT:   atraumatic, no lower extremity edema, contractures present NEURO: Nonverbal, withdraws to pain Skin:   warm and dry, no rash, normal skin turgor, no visible or known soft tissue ulcers   Labs and Imaging: CBC BMET  Recent Labs  Lab 04/16/19 1351  WBC 11.2*  HGB 11.6*  HCT 36.7*  PLT 134*   Recent Labs  Lab 04/16/19 1351  NA 143  K 4.2  CL 111  CO2 26  BUN 21  CREATININE 1.12  GLUCOSE 112*  CALCIUM 8.1*       CT Head Wo Contrast  Result Date: 04/16/2019 CLINICAL DATA:  Altered mental status. EXAM: CT HEAD WITHOUT CONTRAST TECHNIQUE: Contiguous axial images were obtained from the base of the skull through the vertex without intravenous contrast. COMPARISON:  None. FINDINGS: Brain: There is atrophy  and chronic small vessel disease changes. No acute intracranial abnormality. Specifically, no hemorrhage, hydrocephalus, mass lesion, acute infarction, or significant intracranial injury. Vascular: No hyperdense vessel or unexpected calcification. Skull: No acute calvarial abnormality. Sinuses/Orbits: Visualized paranasal sinuses and mastoids clear. Orbital soft tissues unremarkable. Other: none IMPRESSION: Atrophy, chronic microvascular disease. No acute intracranial abnormality. Electronically Signed   By: Rolm Baptise M.D.   On: 04/16/2019 17:58   DG Chest Port 1 View  Result Date: 04/16/2019 CLINICAL DATA:  Fever for 1 day. Altered mental status. EXAM: PORTABLE CHEST 1 VIEW COMPARISON:  None. FINDINGS: 1412 hours. The heart size is at the upper limits of normal for portable semi erect technique. There is a moderate size hiatal hernia and mild aortic atherosclerosis. There are low lung volumes with probable bibasilar atelectasis. No edema, confluent airspace opacity, pneumothorax or significant pleural effusion. Old rib fractures are present on the left. Evidence of a chronic right-sided rotator cuff tear. Telemetry leads overlie the chest. IMPRESSION: 1. Low lung volumes with probable bibasilar atelectasis. No focal airspace disease or edema. 2. Moderate-sized hiatal hernia. Electronically Signed   By: Richardean Sale M.D.   On: 04/16/2019 14:25     Lyndee Hensen, DO PGY-1, [place] 04/16/2019 6:30 PM   [place] Intern pager: [phone], text pages welcome   [place]   I have seen and examined this patient.    I have discussed the findings and exam with the intern and agree with the above note, which I have edited appropriately in [place]. I helped develop the management plan that is described in the resident's note, and I agree with the content.    Marny Lowenstein, MD, MS FAMILY MEDICINE RESIDENT - PGY3 04/16/2019 7:56 PM

## 2019-04-16 NOTE — ED Notes (Signed)
This RN completed initial NIH scale as well as GCS assessment. Pt responsive but unable to follow commands or verbalize appropriate responses.

## 2019-04-16 NOTE — ED Triage Notes (Addendum)
Pt presents to the ED from [facility] with altered mental status that started at 1115 today. Pt also has new onset tremors that started today as well. Pt did receive his first COVID vaccine yesterday and did have covid last year. Pt has dementia at baseline and uses a wheel chair to get around. Rectal temp of 102.3 upon ED arrival.

## 2019-04-16 NOTE — ED Notes (Signed)
Patient transported to CT 

## 2019-04-16 NOTE — ED Notes (Signed)
RN notified of pt temp.

## 2019-04-16 NOTE — ED Notes (Signed)
Bladder scan 105 mL.

## 2019-04-17 DIAGNOSIS — R4182 Altered mental status, unspecified: Secondary | ICD-10-CM | POA: Insufficient documentation

## 2019-04-17 DIAGNOSIS — N390 Urinary tract infection, site not specified: Secondary | ICD-10-CM

## 2019-04-17 LAB — CBC
HCT: 33.4 % — ABNORMAL LOW (ref 39.0–52.0)
Hemoglobin: 10.8 g/dL — ABNORMAL LOW (ref 13.0–17.0)
MCH: 30.3 pg (ref 26.0–34.0)
MCHC: 32.3 g/dL (ref 30.0–36.0)
MCV: 93.6 fL (ref 80.0–100.0)
Platelets: 110 10*3/uL — ABNORMAL LOW (ref 150–400)
RBC: 3.57 MIL/uL — ABNORMAL LOW (ref 4.22–5.81)
RDW: 17.5 % — ABNORMAL HIGH (ref 11.5–15.5)
WBC: 14 10*3/uL — ABNORMAL HIGH (ref 4.0–10.5)
nRBC: 0 % (ref 0.0–0.2)

## 2019-04-17 LAB — BASIC METABOLIC PANEL
Anion gap: 8 (ref 5–15)
BUN: 21 mg/dL (ref 8–23)
CO2: 23 mmol/L (ref 22–32)
Calcium: 7.6 mg/dL — ABNORMAL LOW (ref 8.9–10.3)
Chloride: 112 mmol/L — ABNORMAL HIGH (ref 98–111)
Creatinine, Ser: 1.08 mg/dL (ref 0.61–1.24)
GFR calc Af Amer: >60 mL/min (ref >60)
GFR calc non Af Amer: >60 mL/min (ref >60)
Glucose, Bld: 89 mg/dL (ref 70–99)
Potassium: 4.8 mmol/L (ref 3.5–5.1)
Sodium: 143 mmol/L (ref 135–145)

## 2019-04-17 LAB — PROCALCITONIN: Procalcitonin: 0.74 ng/mL

## 2019-04-17 LAB — VALPROIC ACID LEVEL: Valproic Acid Lvl: <10 ug/mL — ABNORMAL LOW (ref 50.0–100.0)

## 2019-04-17 LAB — MRSA PCR SCREENING: MRSA by PCR: POSITIVE — AB

## 2019-04-17 NOTE — ED Notes (Signed)
Pt's O2 sat noted to be 89%, pt placed on 4L O2 (pt states is home flow), O2 sat 95%

## 2019-04-17 NOTE — Evaluation (Signed)
Clinical/Bedside Swallow Evaluation Patient Details  Name: Alejandro Long MRN: [id number] Date of Birth: Aug 22, 1932  Today's Date: 04/17/2019 Time: SLP Start Time (ACUTE ONLY): 1200 SLP Stop Time (ACUTE ONLY): 1217 SLP Time Calculation (min) (ACUTE ONLY): 17 min  Past Medical History:  Past Medical History:  Diagnosis Date  . Alzheimer's dementia ([place])   . BPH (benign prostatic hyperplasia)    HPI:  Patient with dementia,  has had one day of altered mental status (baseline intermittently responds to questions, knows name). He has had decreased oral  intake, fevers, and rigors. Recently received COVID vaccine. No respiratory symptoms. CXR clear. MD reports Sepsis, likely urinary origin, differential includes post-COVID vaccine reaction. No pneumonia present on CXR, will monitor for symptoms. Influenza negative, agree with below. Rigors and shaking most likely due to fever, monitor.    Assessment / Plan / Recommendation Clinical Impression  Pt awake for assessment, but difficult to reposition, mostly elevated sidelying. Pt took sips of water, bites of puree and ate several animal crackers wihtout overt difficulty. He was noted to have some delayed throat clearing at end of session. Recommend pt initiate a regular diet and thin liquids with pills whole in puree. SLP will f/u for tolerance given throat clearing, but significant dysphagia not anticiapted.  SLP Visit Diagnosis: Dysphagia, unspecified (R13.10)    Aspiration Risk  Mild aspiration risk    Diet Recommendation Regular;Thin liquid   Liquid Administration via: Cup;Straw Medication Administration: Whole meds with puree Supervision: Staff to assist with self feeding Compensations: Slow rate;Small sips/bites Postural Changes: Seated upright at 90 degrees    Other  Recommendations Oral Care Recommendations: Oral care BID   Follow up Recommendations Skilled Nursing facility      Frequency and Duration min 2x/week  1 week        Prognosis        Swallow Study   General HPI: Patient with dementia,  has had one day of altered mental status (baseline intermittently responds to questions, knows name). He has had decreased oral  intake, fevers, and rigors. Recently received COVID vaccine. No respiratory symptoms. CXR clear. MD reports Sepsis, likely urinary origin, differential includes post-COVID vaccine reaction. No pneumonia present on CXR, will monitor for symptoms. Influenza negative, agree with below. Rigors and shaking most likely due to fever, monitor.  Type of Study: Bedside Swallow Evaluation Diet Prior to this Study: NPO Temperature Spikes Noted: No Respiratory Status: Room air History of Recent Intubation: No Behavior/Cognition: Alert;Cooperative;Pleasant mood Oral Cavity Assessment: Within Functional Limits Oral Care Completed by SLP: No Oral Cavity - Dentition: Adequate natural dentition Self-Feeding Abilities: Able to feed self Patient Positioning: Upright in bed Volitional Cough: Strong Volitional Swallow: Able to elicit    Oral/Motor/Sensory Function Overall Oral Motor/Sensory Function: Within functional limits   Ice Chips     Thin Liquid Thin Liquid: Impaired Presentation: Straw Pharyngeal  Phase Impairments: Throat Clearing - Delayed    Nectar Thick Nectar Thick Liquid: Not tested   Honey Thick Honey Thick Liquid: Not tested   Puree Puree: Within functional limits Presentation: Spoon   Solid     Solid: Within functional limits     Herbie [place], MA [place] Pager [phone] Office [phone]  Lynann Beaver 04/17/2019,12:46 PM

## 2019-04-17 NOTE — Progress Notes (Signed)
Family Medicine Teaching Service Daily Progress Note Intern Pager: [phone]  Patient name: Alejandro Long Medical record number: [id number] Date of birth: [birth year] Age: 86 y.o. Gender: male  Primary Care Provider: Patient, No Pcp Per Consultants: None Code Status: Full   Pt Overview and Major Events to Date:  04/16/19:  Admitted   Assessment and Plan:  Holger Arlinghaus is a 86 y.o. male who presented with worsening altered mental status . PMH is significant for hx MI with pacemaker (currently not evidenced on admission), HTN, Organ amyloidosis, BHP, HLD, Insomnia, Anemia    Altered mental status   Urosepsis Patient continues to have rigors.  Afebrile overnight however did spike another fever of 102 F around 7 PM.  Procalcitonin 0.74.  Leukocytosis uptrending (WBC 14 from 11.2).  Urine culture pending blood culture grew gram-positive cocci, likely a contaminant.  Patient oriented to self today, his reported baseline.  Verbally responsive to questions.  We will obtain a valproic acid level to evaluate for toxicity.  Discontinue azithromycin and continue ceftriaxone. - CTX (1/12 - )  - Azithromycin (1/12)  - mIVF at 100 mL/hr  - Tylenol 650 mg PRN  - Monitor fever curve  - Continuous cardiac monitoring  - Continuous pulse oxymetry  -Vitals per routine - Follow up BCx and UCx  - Follow up on AM CBC, BMP - Consult PT / OT / SLP  - NPO while altered - monitor abdominal exam, low threshold to obtain abdominal imaging   HTN  Hx of MI w/ Pacemarker   HLD  No hypertensive, or lipid altering medications listed on intake forms. As staff reported patient was unresponsive hsTrop 14.    Patient with history of pacemaker per SNF intake paperwork however there is pacemaker seen on chest x-ray and no pacing marks on EKG. - Monitor BP   Normocytic Anemia  Hgb on admission 11.6, MCV 94.8.   - monitor with CBC   BPH  hx of bladder diverticulum Home medications include bethanechol 25 mg twice  daily. Bladder scan with less than 200 -Hold home medications while NPO - monitor I/Os  Dementia with mood disorder  Cerebral Amyloid Angiopathy  Medications include Depakote sprinkles 250 mg in the afternoon and 500 mg at bedtime, Donepezil 10 mg at bedtime.  - Consider delirium precautions  - Hold while NPO -Follow-up Depakote level  Insomnia  Home medications include 5 mg Melatonin at bedtime.  - Hold home medications while NPO  FEN/GI: N.p.o. until speech eval PPx: Lovenox  Disposition: Likely returning to SNF  Subjective:  Alejandro Long no significant overnight events.  Is verbally responsive this morning.  Objective: Temp:  [98.6 F (37 C)-102.3 F (39.1 C)] 99.1 F (37.3 C) (01/13 0631) Pulse Rate:  [50-90] 50 (01/13 0915) Resp:  [15-27] 17 (01/13 0915) BP: (86-145)/(44-117) 102/44 (01/13 0915) SpO2:  [89 %-100 %] 96 % (01/13 0915) Weight:  [62.6 kg] 62.6 kg (01/12 2020)  Physical Exam: General: Alert, elderly frail male in no acute distress Cardiovascular: Regular rate and rhythm, pulses intact Respiratory: Clear to auscultation bilaterally Abdomen: Soft, nontender, nondistended Extremities: Limited range of motion due to contracture, no lower extremity edema  Laboratory: Recent Labs  Lab 04/16/19 1351 04/17/19 0525  WBC 11.2* 14.0*  HGB 11.6* 10.8*  HCT 36.7* 33.4*  PLT 134* 110*   Recent Labs  Lab 04/16/19 1351 04/17/19 0525  NA 143 143  K 4.2 4.8  CL 111 112*  CO2 26 23  BUN 21 21  CREATININE  1.12 1.08  CALCIUM 8.1* 7.6*  PROT 5.4*  --   BILITOT 0.4  --   ALKPHOS 40  --   ALT 9  --   AST 14*  --   GLUCOSE 112* 89     Imaging/Diagnostic Tests: CT Head Wo Contrast  Result Date: 04/16/2019 CLINICAL DATA:  Altered mental status. EXAM: CT HEAD WITHOUT CONTRAST TECHNIQUE: Contiguous axial images were obtained from the base of the skull through the vertex without intravenous contrast. COMPARISON:  None. FINDINGS: Brain: There is atrophy  and chronic small vessel disease changes. No acute intracranial abnormality. Specifically, no hemorrhage, hydrocephalus, mass lesion, acute infarction, or significant intracranial injury. Vascular: No hyperdense vessel or unexpected calcification. Skull: No acute calvarial abnormality. Sinuses/Orbits: Visualized paranasal sinuses and mastoids clear. Orbital soft tissues unremarkable. Other: none IMPRESSION: Atrophy, chronic microvascular disease. No acute intracranial abnormality. Electronically Signed   By: Rolm Baptise M.D.   On: 04/16/2019 17:58    Lyndee Hensen, DO 04/17/2019, 9:55 AM PGY-1, [place] Intern pager: [phone], text pages welcome

## 2019-04-17 NOTE — Evaluation (Signed)
Physical Therapy Evaluation Patient Details Name: Alejandro Long MRN: [id number] DOB: 1933-01-04 Today's Date: 04/17/2019   History of Present Illness  Patient is an 86 y/o male who had one day of altered mental status (baseline intermittently responds to questions, knows name). He has had decreased oral  intake, fevers, and rigors. Recently received COVID vaccine. No respiratory symptoms.  PMH positive for MI with pacemaker (currently not evidenced on admission), HTN, Organ amyloidosis, BHP, HLD, Insomnia, Anemia and Alzheimer's Dementia.  Clinical Impression  Patient presents with mobility likely close to baseline with max to total assist for rolling, side to sit and bed to chair.  Feel he will benefit from SNF level rehab to maximize positioning for reducing risk of increased contracture, decreased skin integrity and to maximize safety and QOL.  No further acute skilled PT needs at this time.  Will sign off.     Follow Up Recommendations SNF    Equipment Recommendations  None recommended by PT    Recommendations for Other Services       Precautions / Restrictions Precautions Precautions: Fall Precaution Comments: L UE contracture, limited mobility, at risk for skin breakdown      Mobility  Bed Mobility Overal bed mobility: Needs Assistance Bed Mobility: Rolling;Sidelying to Sit;Sit to Supine Rolling: Max assist Sidelying to sit: Max assist;HOB elevated   Sit to supine: Max assist   General bed mobility comments: assist to roll for hygiene and to change brief, assist side to sit with legs and trunk, assist for both back to supine  Transfers Overall transfer level: Needs assistance Equipment used: None Transfers: Squat Pivot Transfers     Squat pivot transfers: Total assist     General transfer comment: bed to chair and back to bed; pt supporting weight on feet minimally, lifted to chair and back to bed with 80-85% help  Ambulation/Gait             General Gait  Details: unable  Stairs            Wheelchair Mobility    Modified Rankin (Stroke Patients Only)       Balance Overall balance assessment: Needs assistance Sitting-balance support: Feet unsupported Sitting balance-Leahy Scale: Poor Sitting balance - Comments: UE support and S for balance with rounded spine, decreased core stability     Standing balance-Leahy Scale: Zero Standing balance comment: unable to stand erect                             Pertinent Vitals/Pain Pain Assessment: Faces Pain Score: 0-No pain    Home Living Family/patient expects to be discharged to:: Skilled nursing facility                      Prior Function Level of Independence: Needs assistance   Gait / Transfers Assistance Needed: w/c bound at baseline per chart     Comments: patient unable to state PLOF, from SNF with history of dementia     Hand Dominance        Extremity/Trunk Assessment   Upper Extremity Assessment Upper Extremity Assessment: LUE deficits/detail LUE Deficits / Details: held in flexion, internal rotation and superior subluxation, leaves arm under him at times, non functional; cleansed around IV site due to bleeding, then wrapped with gauze    Lower Extremity Assessment Lower Extremity Assessment: LLE deficits/detail;RLE deficits/detail RLE Deficits / Details: flexed at hips/knees in sidelying, able to extend partially, but  not fully. does not lift legs on his own antigravity, able to minimally support weight for bed to chair transfer LLE Deficits / Details: flexed at hips/knees in sidelying, able to extend partially, but not fully. does not lift legs on his own antigravity, able to minimally support weight for bed to chair transfer    Cervical / Trunk Assessment Cervical / Trunk Assessment: Kyphotic;Other exceptions Cervical / Trunk Exceptions: rounded spine, more elongated on L  Communication   Communication: Other (comment)(limited  verbal interactions)  Cognition Arousal/Alertness: Awake/alert Behavior During Therapy: Flat affect Overall Cognitive Status: No family/caregiver present to determine baseline cognitive functioning                                 General Comments: Patient with baseline dementia, per chart responds intermittently which is basically what he was doing during my session, but pulled off clothes, blankets and telemetry leads and sitting in feces when I walked in      General Comments General comments (skin integrity, edema, etc.): assisted to clean, noted perineal area free of skin issues, but around toes. feet areas of errythema pressure related and between knees, placed in position to reduce pressure and improve positioning.    Exercises     Assessment/Plan    PT Assessment All further PT needs can be met in the next venue of care  PT Problem List Decreased skin integrity       PT Treatment Interventions      PT Goals (Current goals can be found in the Care Plan section)  Acute Rehab PT Goals PT Goal Formulation: All assessment and education complete, DC therapy    Frequency     Barriers to discharge        Co-evaluation               AM-PAC PT "6 Clicks" Mobility  Outcome Measure Help needed turning from your back to your side while in a flat bed without using bedrails?: Total Help needed moving from lying on your back to sitting on the side of a flat bed without using bedrails?: Total Help needed moving to and from a bed to a chair (including a wheelchair)?: Total Help needed standing up from a chair using your arms (e.g., wheelchair or bedside chair)?: Total Help needed to walk in hospital room?: Total Help needed climbing 3-5 steps with a railing? : Total 6 Click Score: 6    End of Session   Activity Tolerance: Patient tolerated treatment well Patient left: in bed;with call bell/phone within reach   PT Visit Diagnosis: Muscle weakness  (generalized) (M62.81)    Time: 8309-4076 PT Time Calculation (min) (ACUTE ONLY): 36 min   Charges:   PT Evaluation $PT Eval Moderate Complexity: 1 Mod PT Treatments $Therapeutic Activity: 8-22 mins        Magda Kiel, PT Acute Rehabilitation Services [phone] 04/17/2019   Reginia Naas 04/17/2019, 12:00 PM

## 2019-04-17 NOTE — ED Notes (Signed)
Brief noted to be clean and dry. Repositioned in bed.

## 2019-04-17 NOTE — ED Notes (Signed)
Pt resting comfortably

## 2019-04-17 NOTE — Progress Notes (Signed)
PHARMACY - PHYSICIAN COMMUNICATION CRITICAL VALUE ALERT - BLOOD CULTURE IDENTIFICATION (BCID)  Alejandro Long is an 86 y.o. male who presented to [facility] on 04/16/2019 with a chief complaint of confusion  Assessment:  One set of blood cultures positive for GPC in clusters.  May represent contamination  Name of physician (or Provider) Contacted: [facility]PTS oncall pager  Current antibiotics: Ceftriaxone  Changes to prescribed antibiotics recommended: None  No results found for this or any previous visit.  Candie Mile 04/17/2019  11:59 AM

## 2019-04-17 NOTE — ED Notes (Signed)
This RN checked pt's brief, pt noted to be dry. Pt given pillow and readjusted head of bed for comfort.

## 2019-04-17 NOTE — ED Notes (Signed)
Attempted to call Lavonne Chick (Brother) who is patients only contact person. States number is inactive. [place] to give update and was given another contact.  Volin - [phone] and update was given to her.

## 2019-04-18 ENCOUNTER — Inpatient Hospital Stay (HOSPITAL_COMMUNITY): Payer: Medicare HMO

## 2019-04-18 DIAGNOSIS — E43 Unspecified severe protein-calorie malnutrition: Secondary | ICD-10-CM

## 2019-04-18 DIAGNOSIS — F03918 Unspecified dementia, unspecified severity, with other behavioral disturbance: Secondary | ICD-10-CM

## 2019-04-18 DIAGNOSIS — F0391 Unspecified dementia with behavioral disturbance: Secondary | ICD-10-CM

## 2019-04-18 LAB — CBC
HCT: 34.3 % — ABNORMAL LOW (ref 39.0–52.0)
Hemoglobin: 10.9 g/dL — ABNORMAL LOW (ref 13.0–17.0)
MCH: 29.8 pg (ref 26.0–34.0)
MCHC: 31.8 g/dL (ref 30.0–36.0)
MCV: 93.7 fL (ref 80.0–100.0)
Platelets: 103 10*3/uL — ABNORMAL LOW (ref 150–400)
RBC: 3.66 MIL/uL — ABNORMAL LOW (ref 4.22–5.81)
RDW: 17.3 % — ABNORMAL HIGH (ref 11.5–15.5)
WBC: 9.4 10*3/uL (ref 4.0–10.5)
nRBC: 0 % (ref 0.0–0.2)

## 2019-04-18 LAB — BASIC METABOLIC PANEL
Anion gap: 10 (ref 5–15)
BUN: 22 mg/dL (ref 8–23)
CO2: 23 mmol/L (ref 22–32)
Calcium: 8 mg/dL — ABNORMAL LOW (ref 8.9–10.3)
Chloride: 112 mmol/L — ABNORMAL HIGH (ref 98–111)
Creatinine, Ser: 1.18 mg/dL (ref 0.61–1.24)
GFR calc Af Amer: >60 mL/min (ref >60)
GFR calc non Af Amer: 56 mL/min — ABNORMAL LOW (ref >60)
Glucose, Bld: 65 mg/dL — ABNORMAL LOW (ref 70–99)
Potassium: 3.5 mmol/L (ref 3.5–5.1)
Sodium: 145 mmol/L (ref 135–145)

## 2019-04-18 LAB — URINE CULTURE: Culture: >=100000 — AB

## 2019-04-18 LAB — GLUCOSE, RANDOM: Glucose, Bld: 97 mg/dL (ref 70–99)

## 2019-04-18 MED ORDER — SULFAMETHOXAZOLE-TRIMETHOPRIM 800-160 MG PO TABS
1.0000 | ORAL_TABLET | Freq: Two times a day (BID) | ORAL | Status: DC
  Administered 2019-04-18 – 2019-04-19 (×3): 1 via ORAL
  Filled 2019-04-18 (×4): qty 1

## 2019-04-18 MED ORDER — MUPIROCIN 2 % EX OINT
1.0000 "application " | TOPICAL_OINTMENT | Freq: Two times a day (BID) | CUTANEOUS | Status: DC
  Administered 2019-04-18 – 2019-04-19 (×3): 1 via NASAL
  Filled 2019-04-18 (×3): qty 22

## 2019-04-18 MED ORDER — ADULT MULTIVITAMIN W/MINERALS CH
1.0000 | ORAL_TABLET | Freq: Every day | ORAL | Status: DC
  Administered 2019-04-18 – 2019-04-19 (×2): 1 via ORAL
  Filled 2019-04-18 (×2): qty 1

## 2019-04-18 MED ORDER — CHLORHEXIDINE GLUCONATE CLOTH 2 % EX PADS
6.0000 | MEDICATED_PAD | Freq: Every morning | CUTANEOUS | Status: DC
  Administered 2019-04-18 – 2019-04-19 (×2): 6 via TOPICAL

## 2019-04-18 NOTE — Progress Notes (Addendum)
Family Medicine Teaching Service Daily Progress Note Intern Pager: [phone]  Patient name: Alejandro Long Medical record number: [id number] Date of birth: December 29, 1932 Age: 86 y.o. Gender: male  Primary Care Provider: Patient, No Pcp Per Consultants: None Code Status: Full   Pt Overview and Major Events to Date:  04/16/19:  Admitted   Assessment and Plan:  Alejandro Long is a 86 y.o. male who presented with worsening altered mental status . PMH is significant for hx MI with pacemaker (currently not evidenced on admission), HTN, Organ amyloidosis, BHP, HLD, Insomnia, Anemia    Altered mental status   Urosepsis Tremors present, self oriented , fluent speech but not appropriate. Elevated temperature 100.71F overnight.  No tylenol given. WBC down trending from 14 yesterday to 9.4 today. UCx >100k E.coli ESBL colonies. BCx has MS-co-ag-neg-Staph, likely a contaminant. MRSA PCR positive. Transition or oral abx therapy today. Obtain renal ultrasound to evaluate for hydronephrosis.  - CTX (1/12 - ),   Transition to oral Bactrim,  - Follow up Renal ultrasound  - Post void residual.  - Azithromycin (1/12)  -  Discontinued mIVF at 100 mL/hr  - Tylenol 650 mg PRN  - Monitor fever curve   - Continuous cardiac monitoring  - Continuous pulse oxymetry  - Vitals per routine - Follow up BCx and UCx : - Follow up on AM CBC, BMP - Consulted PT rec's SNF / OT  - SLP: regular diet  - monitor abdominal exam, low threshold to obtain abdominal imaging   HTN  Hx of MI w/ Pacemarker  HLD  Intermittently bradycardic. No hypertensive, or lipid altering medications listed on intake forms. As staff reported patient was unresponsive hsTrop 14.  Patient with history of pacemaker per SNF intake paperwork however there is pacemaker seen on chest x-ray and no pacing marks on EKG. - Monitor BP   Normocytic Anemia  Stable hemoglobin 10.9 today . Hgb on admission 11.6, MCV 94.8.   - monitor with CBC   BPH  hx of  bladder diverticulum Home medications include bethanechol 25 mg twice daily.  -Hold home medications until imaging complete  - monitor I/Os - Monitor bladder scans   - Follow up renal ultrasound  - Consider restarting bethanochol, adding Flomax   Dementia with mood disorder  Cerebral Amyloid Angiopathy  Medications include Depakote sprinkles 250 mg in the afternoon and 500 mg at bedtime, Donepezil 10 mg at bedtime.  - Consider delirium precautions  - Hold Depakote    Insomnia  Home medications include 5 mg Melatonin at bedtime.  - Hold home medication   FEN/GI: Regular diet  PPx: Lovenox  Disposition: Likely returning to SNF  Subjective:  Alejandro Long had an elevated temp of 100.71F overnight.   Objective: Temp:  [98.5 F (36.9 C)-100.3 F (37.9 C)] 98.8 F (37.1 C) (01/14 0721) Pulse Rate:  [44-68] 51 (01/14 0721) Resp:  [14-21] 18 (01/14 0721) BP: (103-133)/(50-79) 133/79 (01/14 0721) SpO2:  [95 %-100 %] 95 % (01/14 0721) Weight:  [56 kg] 56 kg (01/14 0355)  Physical Exam: GEN: pleasantly demented, in no acute distress  CV: regular rate and rhythm, distal pulses intact  RESP: no increased work of breathing, clear to ascultation bilaterally with no crackles, wheezes, or rhonchi  ABD: Bowel sounds present. Soft, Nontender, Nondistended. MSK: no lower extremity edema or tenderness, RUE contracture SKIN: warm, dry NEURO: oriented to self, fluent speech but inappropriate (at baseline)      Laboratory: Recent Labs  Lab 04/16/19 1351 04/17/19 0525  04/18/19 0341  WBC 11.2* 14.0* 9.4  HGB 11.6* 10.8* 10.9*  HCT 36.7* 33.4* 34.3*  PLT 134* 110* 103*   Recent Labs  Lab 04/16/19 1351 04/17/19 0525 04/18/19 0341  NA 143 143 145  K 4.2 4.8 3.5  CL 111 112* 112*  CO2 26 23 23   BUN 21 21 22   CREATININE 1.12 1.08 1.18  CALCIUM 8.1* 7.6* 8.0*  PROT 5.4*  --   --   BILITOT 0.4  --   --   ALKPHOS 40  --   --   ALT 9  --   --   AST 14*  --   --   GLUCOSE  112* 89 65*     Imaging/Diagnostic Tests: No results found.  Lyndee Hensen, DO 04/18/2019, 9:45 AM PGY-1, [place] Intern pager: [phone], text pages welcome

## 2019-04-18 NOTE — Progress Notes (Signed)
Patient HR drop to 27 one of the leads was off at the time and also patient was sleeping, asymptomatic also the bladder scan was done 64 cc in the bladder unable to get post void patient is confuse and have condom cath on. MD notified no new at this time will continue to monitor.

## 2019-04-18 NOTE — Progress Notes (Signed)
Initial Nutrition Assessment  DOCUMENTATION CODES:   Severe malnutrition in context of chronic illness  INTERVENTION:   Magic Cup TID with meals (provides 290 kcals, 9g protein per serving)  Snacks BID  MVI daily   NUTRITION DIAGNOSIS:   Severe Malnutrition related to chronic illness(dementia) as evidenced by severe fat depletion, moderate muscle depletion, moderate fat depletion, severe muscle depletion.  GOAL:   Patient will meet greater than or equal to 90% of their needs   MONITOR:   PO intake, Weight trends, Supplement acceptance, Labs  REASON FOR ASSESSMENT:   Consult Assessment of nutrition requirement/status  ASSESSMENT:   Pt is an 86 year old SNF resident with a PMH significant for MI with pacemaker, HTN, organ amyloidosis, BHP, HLD, anemia, and dementia (baseline oriented to self). Pt presented to the ED with hypotension, rigors, fever, and worsening altered mental status. Pt recently received COVID vaccine. MD reports sepsis (likely urinary origin), differential includes post-COVID vaccine reaction.   Pt was COVID positive >90 days ago. Decreased PO intake noted by MD. Pt documented to be eating 65-95% in the hospital. When asked about intake, pt reported eating "everything but the plate" and reports he "just wants to eat good foods." Pt unable to answer questions regarding appetite prior to admission, usual body weight, weight loss, or changes to the way his clothing has been fitting. Pt states he does not like Ensure, but is agreeable to [employer] and snacks between meals.   No BM documented, but pt reports having a BM daily and states his last BM was yesterday.   Medications reviewed and include: NS @ 179mL/hour, IV abx  Labs reviewed  UOP: 350mL I/O: +4,714.29mL since admit   Of note, pt's weight was noted to be 62.596 kg on 1/12, then 56kg on 1/14. Checked pt's weight using bed scale at time of visit; bed scale read 62 kg, so will use 62 kg to calculate  needs.   NUTRITION - FOCUSED PHYSICAL EXAM:    Most Recent Value  Orbital Region  Moderate depletion  Upper Arm Region  Severe depletion  Thoracic and Lumbar Region  Severe depletion  Buccal Region  Moderate depletion  Temple Region  Moderate depletion  Clavicle Bone Region  Severe depletion  Clavicle and Acromion Bone Region  Severe depletion  Scapular Bone Region  Severe depletion  Dorsal Hand  Moderate depletion  Patellar Region  Severe depletion  Anterior Thigh Region  Severe depletion  Posterior Calf Region  Severe depletion  Edema (RD Assessment)  None  Hair  Reviewed  Eyes  Reviewed  Mouth  Reviewed  Skin  Reviewed  Nails  Reviewed       Diet Order:   Diet Order            Diet regular Room service appropriate? Yes; Fluid consistency: Thin  Diet effective now              EDUCATION NEEDS:   No education needs have been identified at this time  Skin:  Skin Assessment: Reviewed RN Assessment  Last BM:  Unknown  Height:   Ht Readings from Last 1 Encounters:  04/16/19 5\' 10"  (1.778 m)    Weight:   Wt Readings from Last 5 Encounters:  04/18/19 56 kg    Ideal Body Weight:  75.45 kg  BMI:  Body mass index is 17.71 kg/m.  Estimated Nutritional Needs:   Kcal:  1700-1900  Protein:  75-85 grams  Fluid:  >1.7L   Larkin Ina, MS,  RD, LDN Pager: [phone] Weekend/After Hours Pager: [phone]

## 2019-04-18 NOTE — Progress Notes (Signed)
Family Medicine Teaching Service Daily Progress Note Intern Pager: [phone]  Patient name: Alejandro Long Medical record number: [id number] Date of birth: [birth year] Age: 86 y.o. Gender: male  Primary Care Provider: Patient, No Pcp Per Consultants: None Code Status: Full   Pt Overview and Major Events to Date:  04/16/19:  Admitted  04/18/19: Renal US  Assessment and Plan:  Alejandro Long is a 86 y.o. male who presented with worsening altered mental status . PMH is significant for hx MI with pacemaker (currently not evidenced on admission), HTN, Organ amyloidosis, BHP, HLD, Insomnia, Anemia    Altered mental status, resolved   Urosepsis Resting tremors present. Neuro exam at baseline. Remains afebrile.   UCx growing ESBL E.coli sensitive to Bactrim. BCx showed Co-ag negative staph that was a contaminant. Post residual void was unable to be obtained has patient is demented and wearing a condom catheter. Renal ultrasound was unremarkable, no evidence of hydronephrosis. Potassium 3.3, repleted. Patient is medically optimized for discharge to SNF.  - Bactrim 800-160mg  BID  (1/14 - to end 1/27)   - CTX (1/12 -1/14) -  Azithromycin (1/12)  - Tylenol 650 mg PRN  - Monitor fever curve   - Continuous cardiac monitoring  - Continuous pulse oxymetry  - Vitals per routine -BCx: one set NG at 2 days, second set Gm+cocci contaminant  -UCx: ESBL E.coli - Follow up on AM CBC, BMP - Consulted PT rec's SNF - OT rec's pending   - SLP following, appreciate rec's    HTN  Hx of MI w/ Pacemarker  HLD   HR reached 27 while sleeping yesterday.  No hypertensive, or lipid altering medications listed on intake forms. As staff reported patient was unresponsive hsTrop 14.  Patient with history of pacemaker per SNF intake paperwork however there is pacemaker seen on chest x-ray and no pacing marks on EKG. - Monitor BP   Normocytic Anemia  Stable hemoglobin 10.5 today . Hgb on admission 11.6, MCV 94.8.   -  monitor with CBC   BPH  hx of bladder diverticulum Home medications include bethanechol 25 mg twice daily.  -Hold home medications until imaging complete  - monitor I/Os - Monitor bladder scans   - Restart bethanochol  Dementia with mood disorder  Cerebral Amyloid Angiopathy  Medications include Depakote sprinkles 250 mg in the afternoon and 500 mg at bedtime, Donepezil 10 mg at bedtime.  - Consider delirium precautions  - Hold Depakote    Insomnia  Home medications include 5 mg Melatonin at bedtime.  - Hold home medication   FEN/GI: Regular diet  PPx: Lovenox  Disposition: Likely returning to SNF  Subjective:  Alejandro Long had no significant overnight events.    Objective: Temp:  [98.3 F (36.8 C)-99.7 F (37.6 C)] 98.3 F (36.8 C) (01/15 0453) Pulse Rate:  [56-66] 57 (01/15 0453) Resp:  [17-20] 20 (01/15 0453) BP: (113-145)/(66-81) 131/74 (01/15 0453) SpO2:  [94 %-97 %] 97 % (01/15 0453) Weight:  [57 kg-57.8 kg] 57.8 kg (01/15 0453)  Physical Exam:  GEN: pleasantly demented elderly male, in no acute distress  CV: regular rate and rhythm, distal pulses intact  RESP: no increased work of breathing, clear to ascultation bilaterally with no crackles, wheezes, or rhonchi  ABD: Bowel sounds present. Soft, Nontender, Nondistended MSK: no lower extremity edema or tenderness SKIN: warm, dry NEURO: oriented to self (baseline)    Laboratory: Recent Labs  Lab 04/17/19 0525 04/18/19 0341 04/19/19 0400  WBC 14.0* 9.4 4.9  HGB  10.8* 10.9* 10.5*  HCT 33.4* 34.3* 31.9*  PLT 110* 103* 122*   Recent Labs  Lab 04/16/19 1351 04/16/19 1351 04/17/19 0525 04/17/19 0525 04/18/19 0341 04/18/19 1441 04/19/19 0400  NA 143   < > 143  --  145  --  143  K 4.2   < > 4.8  --  3.5  --  3.3*  CL 111   < > 112*  --  112*  --  112*  CO2 26   < > 23  --  23  --  23  BUN 21   < > 21  --  22  --  19  CREATININE 1.12   < > 1.08  --  1.18  --  1.04  CALCIUM 8.1*   < > 7.6*   --  8.0*  --  8.2*  PROT 5.4*  --   --   --   --   --   --   BILITOT 0.4  --   --   --   --   --   --   ALKPHOS 40  --   --   --   --   --   --   ALT 9  --   --   --   --   --   --   AST 14*  --   --   --   --   --   --   GLUCOSE 112*   < > 89   < > 65* 97 83   < > = values in this interval not displayed.     Imaging/Diagnostic Tests: US RENAL  Result Date: 04/18/2019 CLINICAL DATA:  Urinary tract infection. EXAM: RENAL / URINARY TRACT ULTRASOUND COMPLETE COMPARISON:  None. FINDINGS: Right Kidney: Renal measurements: 8.8 x 5 5 x 5.5 cm = volume: 138 mL . Echogenicity within normal limits. No mass or hydronephrosis visualized. Left Kidney: Renal measurements: 10.1 x 4.9 x 4.3 cm = volume: 110 mL. Echogenicity within normal limits. No mass or hydronephrosis visualized. Bladder: Appears normal for degree of bladder distention. Other: None. IMPRESSION: Normal renal ultrasound. Electronically Signed   By: Marijo Conception M.D.   On: 04/18/2019 14:15    Lyndee Hensen, DO 04/19/2019, 9:45 AM   PGY-1, [place] Intern pager: [phone], text pages welcome

## 2019-04-18 NOTE — Progress Notes (Signed)
  Speech Language Pathology Treatment: Dysphagia  Patient Details Name: Alejandro Long MRN: [id number] DOB: 27-May-1932 Today's Date: 04/18/2019 Time: 1455-1510 SLP Time Calculation (min) (ACUTE ONLY): 15 min  Assessment / Plan / Recommendation Clinical Impression  Pt seen at bedside for follow up after BSE completed 04/17/19. Nursing reports no overt s/s aspiration at breakfast or lunch, however, pt exhibited consistent throat clearing and cough following trials of thin liquid today, evern several minutes after po trials had ended. Will downgrade liquids to nectar thick to minimize aspiration risk. SLP will continue to follow to assess diet tolerance and provide education. Safe swallow precautions posted at [facility].    HPI HPI: Patient with dementia,  has had one day of altered mental status (baseline intermittently responds to questions, knows name). He has had decreased oral  intake, fevers, and rigors. Recently received COVID vaccine. No respiratory symptoms. CXR clear. MD reports Sepsis, likely urinary origin, differential includes post-COVID vaccine reaction. No pneumonia present on CXR, will monitor for symptoms. Influenza negative, agree with below. Rigors and shaking most likely due to fever, monitor.       SLP Plan  Continue with current plan of care       Recommendations  Diet recommendations: Regular; Nectar thick liquid Liquids provided via: Straw;Cup Medication Administration: Whole meds with puree Supervision: Staff to assist with self feeding;Full supervision/cueing for compensatory strategies Compensations: Slow rate;Small sips/bites Postural Changes and/or Swallow Maneuvers: Seated upright 90 degrees;Upright 30-60 min after meal                Oral Care Recommendations: Oral care BID Follow up Recommendations: Skilled Nursing facility;24 hour supervision/assistance SLP Visit Diagnosis: Dysphagia, unspecified (R13.10) Plan: Continue with current plan of care        [place], [facility], [place] Pathologist Office: [phone] Pager: [phone]  Shonna Chock 04/18/2019, 3:11 PM

## 2019-04-18 NOTE — Progress Notes (Signed)
OT Cancellation Note  Patient Details Name: Alejandro Long MRN: [id number] DOB: 01-17-1933   Cancelled Treatment:    Reason Eval/Treat Not Completed: Patient at procedure or test/ unavailable. Pt off unit. Will follow and see as able.   Jolaine Artist, OT Acute Rehabilitation Services Pager [phone] Office [phone]    Delight Stare 04/18/2019, 1:09 PM

## 2019-04-19 DIAGNOSIS — F0151 Vascular dementia with behavioral disturbance: Secondary | ICD-10-CM

## 2019-04-19 LAB — BASIC METABOLIC PANEL
Anion gap: 8 (ref 5–15)
BUN: 19 mg/dL (ref 8–23)
CO2: 23 mmol/L (ref 22–32)
Calcium: 8.2 mg/dL — ABNORMAL LOW (ref 8.9–10.3)
Chloride: 112 mmol/L — ABNORMAL HIGH (ref 98–111)
Creatinine, Ser: 1.04 mg/dL (ref 0.61–1.24)
GFR calc Af Amer: >60 mL/min (ref >60)
GFR calc non Af Amer: >60 mL/min (ref >60)
Glucose, Bld: 83 mg/dL (ref 70–99)
Potassium: 3.3 mmol/L — ABNORMAL LOW (ref 3.5–5.1)
Sodium: 143 mmol/L (ref 135–145)

## 2019-04-19 LAB — CBC
HCT: 31.9 % — ABNORMAL LOW (ref 39.0–52.0)
Hemoglobin: 10.5 g/dL — ABNORMAL LOW (ref 13.0–17.0)
MCH: 29.9 pg (ref 26.0–34.0)
MCHC: 32.9 g/dL (ref 30.0–36.0)
MCV: 90.9 fL (ref 80.0–100.0)
Platelets: 122 10*3/uL — ABNORMAL LOW (ref 150–400)
RBC: 3.51 MIL/uL — ABNORMAL LOW (ref 4.22–5.81)
RDW: 16.9 % — ABNORMAL HIGH (ref 11.5–15.5)
WBC: 4.9 10*3/uL (ref 4.0–10.5)
nRBC: 0 % (ref 0.0–0.2)

## 2019-04-19 LAB — CULTURE, BLOOD (ROUTINE X 2)

## 2019-04-19 MED ORDER — SULFAMETHOXAZOLE-TRIMETHOPRIM 800-160 MG PO TABS: 1.0000 | ORAL_TABLET | Freq: Two times a day (BID) | ORAL | 0 refills | Status: DC

## 2019-04-19 MED ORDER — SULFAMETHOXAZOLE-TRIMETHOPRIM 800-160 MG PO TABS: 1.0000 | ORAL_TABLET | Freq: Two times a day (BID) | ORAL | 0 refills | Status: AC

## 2019-04-19 MED ORDER — POTASSIUM CHLORIDE CRYS ER 20 MEQ PO TBCR
40.0000 meq | EXTENDED_RELEASE_TABLET | Freq: Two times a day (BID) | ORAL | Status: DC
  Administered 2019-04-19: 40 meq via ORAL
  Filled 2019-04-19: qty 2

## 2019-04-19 NOTE — TOC Initial Note (Signed)
Transition of Care [facility] - Initial/Assessment Note    Patient Details  Name: Alejandro Long MRN: [id number] Date of Birth: 01/04/1933  Transition of Care [facility]) CM/SW Contact:    Vinie Sill, [place] Phone Number: 04/19/2019, 11:19 AM  Clinical Narrative:                  CSW spoke with the patient's legal guardian,Ann, she confirmed the patient is from [facility]. She agrees that the plan is for the patient to return to [place] when medically stable.  CSW contacted [facility], they have confirmed placement.    Thurmond Butts, MSW, [place] Clinical Social Worker  Expected Discharge Plan: Skilled Nursing Facility Barriers to Discharge: No Barriers Identified   Patient Goals and CMS Choice        Expected Discharge Plan and Services Expected Discharge Plan: [place] In-house Referral: Clinical Social Work     Living arrangements for the past 2 months: [place]                                      Prior Living Arrangements/Services Living arrangements for the past 2 months: [place] Lives with:: Self, Facility Resident Patient language and need for interpreter reviewed:: No        Need for Family Participation in Patient Care: No (Comment) Care giver support system in place?: Yes (comment)   Criminal Activity/Legal Involvement Pertinent to Current Situation/Hospitalization: No - Comment as needed  Activities of Daily Living      Permission Sought/Granted Permission sought to share information with : Family Supports Permission granted to share information with : Yes, Verbal Permission Granted  Share Information with NAME: Myrlene Broker     Permission granted to share info w Relationship: legal guardian  Permission granted to share info w Contact Information: [phone]  Emotional Assessment       Orientation: : Oriented to Self Alcohol / Substance Use: Not Applicable Psych Involvement: No  (comment)  Admission diagnosis:  Sepsis ([place]) [A41.9] Urinary tract infection without hematuria, site unspecified [N39.0] Altered mental status, unspecified altered mental status type [R41.82] Sepsis, due to unspecified organism, unspecified whether acute organ dysfunction present [facility]) [A41.9] Patient Active Problem List   Diagnosis Date Noted  . Protein-calorie malnutrition, severe 04/18/2019  . Dementia with behavioral disturbance ([place])   . Altered mental status   . Urinary tract infection without hematuria   . Sepsis ([place]) 04/16/2019   PCP:  Patient, No Pcp Per Pharmacy:  No Pharmacies Listed    Social Determinants of Health (SDOH) Interventions    Readmission Risk Interventions No flowsheet data found.

## 2019-04-19 NOTE — Plan of Care (Signed)

## 2019-04-19 NOTE — Care Management Important Message (Signed)
Important Message  Patient Details  Name: Said Schnack MRN: [id number] Date of Birth: [birth year]   Medicare Important Message Given:  Yes     Zenon Mayo, RN 04/19/2019, 5:50 PM

## 2019-04-19 NOTE — TOC Transition Note (Signed)
Transition of Care [facility]) - CM/SW Discharge Note   Patient Details  Name: Alejandro Long MRN: [id number] Date of Birth: [birth year]  Transition of Care [facility]) CM/SW Contact:  Vinie Sill, [place] Phone Number: 04/19/2019, 2:09 PM   Clinical Narrative:     Patient will DC to: [facility]  DC Date: 04/19/2019 Family Notified: Ann,legal gaurdian Transport [id number]  RN, patient, and facility notified of DC. Discharge Summary sent to facility. RN given number for [phone]. Ambulance transport requested for patient.   Clinical Social Worker signing off. Thurmond Butts, MSW, [place] Clinical Social Worker    Final next level of care: Skilled Nursing Facility([facility]) Barriers to Discharge: No Barriers Identified   Patient Goals and CMS Choice        Discharge Placement                       Discharge Plan and Services In-house Referral: Clinical Social Work                                   Social Determinants of Health (SDOH) Interventions     Readmission Risk Interventions No flowsheet data found.

## 2019-04-19 NOTE — Evaluation (Signed)
Occupational Therapy Evaluation Patient Details Name: Alejandro Long MRN: [id number] DOB: Jul 18, 1932 Today's Date: 04/19/2019    History of Present Illness Patient is an 86 y/o male who had one day of altered mental status (baseline intermittently responds to questions, knows name). He has had decreased oral  intake, fevers, and rigors. Recently received COVID vaccine. No respiratory symptoms.  PMH positive for MI with pacemaker (currently not evidenced on admission), HTN, Organ amyloidosis, BHP, HLD, Insomnia, Anemia and Alzheimer's Dementia.   Clinical Impression   Pt likely functioning at his baseline in ADL and mobility. No OT needs.    Follow Up Recommendations  No OT follow up;SNF    Equipment Recommendations       Recommendations for Other Services       Precautions / Restrictions Precautions Precautions: Fall Precaution Comments: L UE contracture, limited mobility, at risk for skin breakdown Restrictions Weight Bearing Restrictions: No      Mobility Bed Mobility Overal bed mobility: Needs Assistance Bed Mobility: Rolling;Sidelying to Sit;Sit to Supine Rolling: Max assist Sidelying to sit: Max assist;HOB elevated   Sit to supine: Max assist   General bed mobility comments: assist for all aspects  Transfers                 General transfer comment: would require lift, NT    Balance Overall balance assessment: Needs assistance   Sitting balance-Leahy Scale: Poor                                     ADL either performed or assessed with clinical judgement   ADL Overall ADL's : At baseline                                             Vision Patient Visual Report: No change from baseline       Perception     Praxis      Pertinent Vitals/Pain Pain Assessment: Faces Faces Pain Scale: No hurt     Hand Dominance Right   Extremity/Trunk Assessment Upper Extremity Assessment LUE Deficits / Details: held in  flexion, internal rotation and superior subluxation, leaves arm under him at times, non functional; cleansed around IV site due to bleeding, then wrapped with gauze       Cervical / Trunk Assessment Cervical / Trunk Assessment: Kyphotic   Communication Communication Communication: Other (comment)(minimal verbalization)   Cognition Arousal/Alertness: Awake/alert Behavior During Therapy: Flat affect Overall Cognitive Status: No family/caregiver present to determine baseline cognitive functioning                                 General Comments: Patient with baseline dementia, per chart responds intermittently which is basically what he was doing during my session, but pulled off clothes, blankets and telemetry leads and sitting in feces when I walked in   General Comments       Exercises     Shoulder Instructions      Home Living Family/patient expects to be discharged to:: Skilled nursing facility                                 Additional Comments: pt resides  in [facility]      Prior Functioning/Environment Level of Independence: Needs assistance  Gait / Transfers Assistance Needed: w/c bound at baseline per chart ADL's / Homemaking Assistance Needed: dependent in bathing and dressing, self feeds   Comments: h/o dementia        OT Problem List:        OT Treatment/Interventions:      OT Goals(Current goals can be found in the care plan section)    OT Frequency:     Barriers to D/C:            Co-evaluation              AM-PAC OT "6 Clicks" Daily Activity     Outcome Measure Help from another person eating meals?: A Little Help from another person taking care of personal grooming?: Total Help from another person toileting, which includes using toliet, bedpan, or urinal?: Total Help from another person bathing (including washing, rinsing, drying)?: Total Help from another person to put on and taking off regular upper body  clothing?: Total Help from another person to put on and taking off regular lower body clothing?: Total 6 Click Score: 8   End of Session    Activity Tolerance: Patient tolerated treatment well Patient left: in bed;with call bell/phone within reach;with bed alarm set  OT Visit Diagnosis: Muscle weakness (generalized) (M62.81);Other symptoms and signs involving cognitive function                Time: [id number] OT Time Calculation (min): 17 min Charges:  OT General Charges $OT Visit: 1 Visit OT Evaluation $OT Eval Moderate Complexity: 1 Mod  Nestor Lewandowsky, OTR/L Acute Rehabilitation Services Pager: [phone] Office: [phone]  Malka So 04/19/2019, 12:59 PM

## 2019-04-19 NOTE — Discharge Instructions (Signed)
Thank you so much for allowing [place] to be a part of Mr. Marchesi's care.  He was found to have a urinary tract infection that was likely leading to his change in mental status and fever, he received antibiotics and fluids while he was here and will be sent home on oral antibiotics.  He will continue with the antibiotic called Bactrim through 04/24/2019.  Fortunately, he is back to his regular mental state and has not had any further fevers.  In terms of his chronic medications, he did not receive his bethanechol while he was here and had appropriate urine output with <100 mL of urine on bladder scans.  We recommend reassessing the continuation of his bethanechol in the future as appropriate.  Additionally, we have held his Aricept at discharge and may restart as appropriate.  If he has any recurrent change of mental status, chest pain, difficulty breathing, or significant fever--recommend seeking medical care.  Encouraged follow-up with his provider at his nursing facility.

## 2019-04-19 NOTE — Progress Notes (Signed)
  Speech Language Pathology Treatment: Dysphagia  Patient Details Name: Alejandro Long MRN: [id number] DOB: Aug 03, 1932 Today's Date: 04/19/2019 Time: [id number] SLP Time Calculation (min) (ACUTE ONLY): 30 min  Assessment / Plan / Recommendation Clinical Impression  Pt seen at bedside during lunch of regular texture solids, nectar thick liquids, and thin liquid trials. Pt has a really good appetite, eating all of his lunch and drinking half of his liquids. No cough or throat clearing elicited during the meal. Pt was given trials of thin liquid, which resulted in throat clearing. Will continue current diet (regular/nectar thick liquid) for now. Pt being DC'd today. Recommend follow up with ST at next venue to continue treatment for advanced liquids.     HPI HPI: Patient with dementia,  has had one day of altered mental status (baseline intermittently responds to questions, knows name). He has had decreased oral  intake, fevers, and rigors. Recently received COVID vaccine. No respiratory symptoms. CXR clear. MD reports Sepsis, likely urinary origin, differential includes post-COVID vaccine reaction. No pneumonia present on CXR, will monitor for symptoms. Influenza negative, agree with below. Rigors and shaking most likely due to fever, monitor.       SLP Plan  Continue with current plan of care       Recommendations  Diet recommendations: Regular;Nectar-thick liquid Liquids provided via: Straw;Cup Medication Administration: Whole meds with puree Supervision: Staff to assist with self feeding;Full supervision/cueing for compensatory strategies Compensations: Slow rate;Small sips/bites Postural Changes and/or Swallow Maneuvers: Seated upright 90 degrees;Upright 30-60 min after meal                Oral Care Recommendations: Oral care BID Follow up Recommendations: Skilled Nursing facility;24 hour supervision/assistance SLP Visit Diagnosis: Dysphagia, unspecified (R13.10) Plan: Continue  with current plan of care       Dawson B. Quentin Ore, [facility], [place] Speech Language Pathologist Office: [phone] Pager: [phone]  Shonna Chock 04/19/2019, 12:32 PM

## 2019-04-19 NOTE — Plan of Care (Signed)

## 2019-04-21 LAB — CULTURE, BLOOD (ROUTINE X 2): Culture: NO GROWTH

## 2019-04-22 DIAGNOSIS — G301 Alzheimer's disease with late onset: Secondary | ICD-10-CM | POA: Diagnosis not present

## 2019-04-22 DIAGNOSIS — B9629 Other Escherichia coli [E. coli] as the cause of diseases classified elsewhere: Secondary | ICD-10-CM | POA: Diagnosis not present

## 2019-04-22 DIAGNOSIS — Z03818 Encounter for observation for suspected exposure to other biological agents ruled out: Secondary | ICD-10-CM | POA: Diagnosis not present

## 2019-04-22 DIAGNOSIS — N400 Enlarged prostate without lower urinary tract symptoms: Secondary | ICD-10-CM | POA: Diagnosis not present

## 2019-04-22 DIAGNOSIS — N390 Urinary tract infection, site not specified: Secondary | ICD-10-CM | POA: Diagnosis not present

## 2019-04-23 DIAGNOSIS — G301 Alzheimer's disease with late onset: Secondary | ICD-10-CM | POA: Diagnosis not present

## 2019-04-23 DIAGNOSIS — R4182 Altered mental status, unspecified: Secondary | ICD-10-CM | POA: Diagnosis not present

## 2019-04-23 DIAGNOSIS — R69 Illness, unspecified: Secondary | ICD-10-CM | POA: Diagnosis not present

## 2019-04-23 DIAGNOSIS — D649 Anemia, unspecified: Secondary | ICD-10-CM | POA: Diagnosis not present

## 2019-04-23 DIAGNOSIS — I252 Old myocardial infarction: Secondary | ICD-10-CM | POA: Diagnosis not present

## 2019-04-23 DIAGNOSIS — E785 Hyperlipidemia, unspecified: Secondary | ICD-10-CM | POA: Diagnosis not present

## 2019-04-23 DIAGNOSIS — I10 Essential (primary) hypertension: Secondary | ICD-10-CM | POA: Diagnosis not present

## 2019-04-26 DIAGNOSIS — R42 Dizziness and giddiness: Secondary | ICD-10-CM | POA: Diagnosis not present

## 2019-04-26 DIAGNOSIS — R5381 Other malaise: Secondary | ICD-10-CM | POA: Diagnosis not present

## 2019-04-26 DIAGNOSIS — R69 Illness, unspecified: Secondary | ICD-10-CM | POA: Diagnosis not present

## 2019-04-26 DIAGNOSIS — K219 Gastro-esophageal reflux disease without esophagitis: Secondary | ICD-10-CM | POA: Diagnosis not present

## 2019-04-26 DIAGNOSIS — G4700 Insomnia, unspecified: Secondary | ICD-10-CM | POA: Diagnosis not present

## 2019-04-26 DIAGNOSIS — N179 Acute kidney failure, unspecified: Secondary | ICD-10-CM | POA: Diagnosis not present

## 2019-04-26 DIAGNOSIS — I10 Essential (primary) hypertension: Secondary | ICD-10-CM | POA: Diagnosis not present

## 2019-04-26 DIAGNOSIS — R270 Ataxia, unspecified: Secondary | ICD-10-CM | POA: Diagnosis not present

## 2019-04-27 DIAGNOSIS — G9341 Metabolic encephalopathy: Secondary | ICD-10-CM | POA: Diagnosis not present

## 2019-04-27 DIAGNOSIS — M4856XD Collapsed vertebra, not elsewhere classified, lumbar region, subsequent encounter for fracture with routine healing: Secondary | ICD-10-CM | POA: Diagnosis not present

## 2019-04-27 DIAGNOSIS — R69 Illness, unspecified: Secondary | ICD-10-CM | POA: Diagnosis not present

## 2019-04-29 DIAGNOSIS — Z03818 Encounter for observation for suspected exposure to other biological agents ruled out: Secondary | ICD-10-CM | POA: Diagnosis not present

## 2019-05-04 DIAGNOSIS — E559 Vitamin D deficiency, unspecified: Secondary | ICD-10-CM | POA: Diagnosis not present

## 2019-05-04 DIAGNOSIS — R262 Difficulty in walking, not elsewhere classified: Secondary | ICD-10-CM | POA: Diagnosis not present

## 2019-05-04 DIAGNOSIS — N400 Enlarged prostate without lower urinary tract symptoms: Secondary | ICD-10-CM | POA: Diagnosis not present

## 2019-05-06 DIAGNOSIS — R69 Illness, unspecified: Secondary | ICD-10-CM | POA: Diagnosis not present

## 2019-05-06 DIAGNOSIS — Z03818 Encounter for observation for suspected exposure to other biological agents ruled out: Secondary | ICD-10-CM | POA: Diagnosis not present

## 2019-05-06 DIAGNOSIS — G301 Alzheimer's disease with late onset: Secondary | ICD-10-CM | POA: Diagnosis not present

## 2019-05-06 DIAGNOSIS — G4709 Other insomnia: Secondary | ICD-10-CM | POA: Diagnosis not present

## 2019-05-12 DIAGNOSIS — R2681 Unsteadiness on feet: Secondary | ICD-10-CM | POA: Diagnosis not present

## 2019-05-12 DIAGNOSIS — E870 Hyperosmolality and hypernatremia: Secondary | ICD-10-CM | POA: Diagnosis not present

## 2019-05-12 DIAGNOSIS — E559 Vitamin D deficiency, unspecified: Secondary | ICD-10-CM | POA: Diagnosis not present

## 2019-05-12 DIAGNOSIS — N400 Enlarged prostate without lower urinary tract symptoms: Secondary | ICD-10-CM | POA: Diagnosis not present

## 2019-05-13 DIAGNOSIS — Z03818 Encounter for observation for suspected exposure to other biological agents ruled out: Secondary | ICD-10-CM | POA: Diagnosis not present

## 2019-05-14 DIAGNOSIS — R4182 Altered mental status, unspecified: Secondary | ICD-10-CM | POA: Diagnosis not present

## 2019-05-14 DIAGNOSIS — R0902 Hypoxemia: Secondary | ICD-10-CM | POA: Diagnosis not present

## 2019-05-14 DIAGNOSIS — N179 Acute kidney failure, unspecified: Secondary | ICD-10-CM | POA: Diagnosis not present

## 2019-05-14 DIAGNOSIS — E870 Hyperosmolality and hypernatremia: Secondary | ICD-10-CM | POA: Diagnosis not present

## 2019-05-14 DIAGNOSIS — R509 Fever, unspecified: Secondary | ICD-10-CM | POA: Diagnosis not present

## 2019-05-14 DIAGNOSIS — J80 Acute respiratory distress syndrome: Secondary | ICD-10-CM | POA: Diagnosis not present

## 2019-05-14 DIAGNOSIS — I4891 Unspecified atrial fibrillation: Secondary | ICD-10-CM | POA: Diagnosis not present

## 2019-05-14 DIAGNOSIS — E860 Dehydration: Secondary | ICD-10-CM | POA: Diagnosis not present

## 2019-05-14 DIAGNOSIS — J9811 Atelectasis: Secondary | ICD-10-CM | POA: Diagnosis not present

## 2019-05-14 DIAGNOSIS — R404 Transient alteration of awareness: Secondary | ICD-10-CM | POA: Diagnosis not present

## 2019-05-15 DIAGNOSIS — Z7401 Bed confinement status: Secondary | ICD-10-CM | POA: Diagnosis not present

## 2019-05-15 DIAGNOSIS — J9811 Atelectasis: Secondary | ICD-10-CM | POA: Diagnosis not present

## 2019-05-15 DIAGNOSIS — Z681 Body mass index (BMI) 19 or less, adult: Secondary | ICD-10-CM | POA: Diagnosis not present

## 2019-05-15 DIAGNOSIS — R1310 Dysphagia, unspecified: Secondary | ICD-10-CM | POA: Diagnosis not present

## 2019-05-15 DIAGNOSIS — R0902 Hypoxemia: Secondary | ICD-10-CM | POA: Diagnosis not present

## 2019-05-15 DIAGNOSIS — R4182 Altered mental status, unspecified: Secondary | ICD-10-CM | POA: Diagnosis not present

## 2019-05-15 DIAGNOSIS — N390 Urinary tract infection, site not specified: Secondary | ICD-10-CM | POA: Diagnosis not present

## 2019-05-15 DIAGNOSIS — R410 Disorientation, unspecified: Secondary | ICD-10-CM | POA: Diagnosis not present

## 2019-05-15 DIAGNOSIS — N179 Acute kidney failure, unspecified: Secondary | ICD-10-CM | POA: Diagnosis not present

## 2019-05-15 DIAGNOSIS — R509 Fever, unspecified: Secondary | ICD-10-CM | POA: Diagnosis not present

## 2019-05-15 DIAGNOSIS — G9341 Metabolic encephalopathy: Secondary | ICD-10-CM | POA: Diagnosis not present

## 2019-05-15 DIAGNOSIS — E870 Hyperosmolality and hypernatremia: Secondary | ICD-10-CM | POA: Diagnosis not present

## 2019-05-15 DIAGNOSIS — A419 Sepsis, unspecified organism: Secondary | ICD-10-CM | POA: Diagnosis not present

## 2019-05-15 DIAGNOSIS — T50B95A Adverse effect of other viral vaccines, initial encounter: Secondary | ICD-10-CM | POA: Diagnosis not present

## 2019-05-15 DIAGNOSIS — R5083 Postvaccination fever: Secondary | ICD-10-CM | POA: Diagnosis not present

## 2019-05-15 DIAGNOSIS — E43 Unspecified severe protein-calorie malnutrition: Secondary | ICD-10-CM | POA: Diagnosis not present

## 2019-05-15 DIAGNOSIS — M2550 Pain in unspecified joint: Secondary | ICD-10-CM | POA: Diagnosis not present

## 2019-05-15 DIAGNOSIS — E860 Dehydration: Secondary | ICD-10-CM | POA: Diagnosis not present

## 2019-05-16 DIAGNOSIS — A419 Sepsis, unspecified organism: Secondary | ICD-10-CM | POA: Diagnosis not present

## 2019-05-16 DIAGNOSIS — R4182 Altered mental status, unspecified: Secondary | ICD-10-CM | POA: Diagnosis not present

## 2019-05-16 DIAGNOSIS — R0902 Hypoxemia: Secondary | ICD-10-CM | POA: Diagnosis not present

## 2019-05-16 DIAGNOSIS — N179 Acute kidney failure, unspecified: Secondary | ICD-10-CM | POA: Diagnosis not present

## 2019-05-16 DIAGNOSIS — E870 Hyperosmolality and hypernatremia: Secondary | ICD-10-CM | POA: Diagnosis not present

## 2019-05-17 DIAGNOSIS — R0902 Hypoxemia: Secondary | ICD-10-CM | POA: Diagnosis not present

## 2019-05-17 DIAGNOSIS — E870 Hyperosmolality and hypernatremia: Secondary | ICD-10-CM | POA: Diagnosis not present

## 2019-05-17 DIAGNOSIS — N179 Acute kidney failure, unspecified: Secondary | ICD-10-CM | POA: Diagnosis not present

## 2019-05-17 DIAGNOSIS — R4182 Altered mental status, unspecified: Secondary | ICD-10-CM | POA: Diagnosis not present

## 2019-05-17 DIAGNOSIS — A419 Sepsis, unspecified organism: Secondary | ICD-10-CM | POA: Diagnosis not present

## 2019-05-18 DIAGNOSIS — N179 Acute kidney failure, unspecified: Secondary | ICD-10-CM | POA: Diagnosis not present

## 2019-05-18 DIAGNOSIS — R410 Disorientation, unspecified: Secondary | ICD-10-CM | POA: Diagnosis not present

## 2019-05-18 DIAGNOSIS — E870 Hyperosmolality and hypernatremia: Secondary | ICD-10-CM | POA: Diagnosis not present

## 2019-05-18 DIAGNOSIS — R509 Fever, unspecified: Secondary | ICD-10-CM | POA: Diagnosis not present

## 2019-05-18 DIAGNOSIS — R4182 Altered mental status, unspecified: Secondary | ICD-10-CM | POA: Diagnosis not present

## 2019-05-18 DIAGNOSIS — M2550 Pain in unspecified joint: Secondary | ICD-10-CM | POA: Diagnosis not present

## 2019-05-18 DIAGNOSIS — Z7401 Bed confinement status: Secondary | ICD-10-CM | POA: Diagnosis not present

## 2019-05-18 DIAGNOSIS — R0902 Hypoxemia: Secondary | ICD-10-CM | POA: Diagnosis not present

## 2019-05-18 DIAGNOSIS — A419 Sepsis, unspecified organism: Secondary | ICD-10-CM | POA: Diagnosis not present

## 2019-05-20 DIAGNOSIS — R5083 Postvaccination fever: Secondary | ICD-10-CM | POA: Diagnosis not present

## 2019-05-20 DIAGNOSIS — N179 Acute kidney failure, unspecified: Secondary | ICD-10-CM | POA: Diagnosis not present

## 2019-05-20 DIAGNOSIS — R69 Illness, unspecified: Secondary | ICD-10-CM | POA: Diagnosis not present

## 2019-05-20 DIAGNOSIS — E870 Hyperosmolality and hypernatremia: Secondary | ICD-10-CM | POA: Diagnosis not present

## 2019-05-20 DIAGNOSIS — G301 Alzheimer's disease with late onset: Secondary | ICD-10-CM | POA: Diagnosis not present

## 2019-05-21 DIAGNOSIS — D649 Anemia, unspecified: Secondary | ICD-10-CM | POA: Diagnosis not present

## 2019-05-21 DIAGNOSIS — E785 Hyperlipidemia, unspecified: Secondary | ICD-10-CM | POA: Diagnosis not present

## 2019-05-21 DIAGNOSIS — I252 Old myocardial infarction: Secondary | ICD-10-CM | POA: Diagnosis not present

## 2019-05-24 DIAGNOSIS — R69 Illness, unspecified: Secondary | ICD-10-CM | POA: Diagnosis not present

## 2019-05-24 DIAGNOSIS — N390 Urinary tract infection, site not specified: Secondary | ICD-10-CM | POA: Diagnosis not present

## 2019-05-24 DIAGNOSIS — D649 Anemia, unspecified: Secondary | ICD-10-CM | POA: Diagnosis not present

## 2019-05-24 DIAGNOSIS — I10 Essential (primary) hypertension: Secondary | ICD-10-CM | POA: Diagnosis not present

## 2019-05-26 DIAGNOSIS — Z03818 Encounter for observation for suspected exposure to other biological agents ruled out: Secondary | ICD-10-CM | POA: Diagnosis not present

## 2019-05-27 DIAGNOSIS — J156 Pneumonia due to other aerobic Gram-negative bacteria: Secondary | ICD-10-CM | POA: Diagnosis not present

## 2019-05-27 DIAGNOSIS — K219 Gastro-esophageal reflux disease without esophagitis: Secondary | ICD-10-CM | POA: Diagnosis not present

## 2019-05-27 DIAGNOSIS — G301 Alzheimer's disease with late onset: Secondary | ICD-10-CM | POA: Diagnosis not present

## 2019-05-27 DIAGNOSIS — R5381 Other malaise: Secondary | ICD-10-CM | POA: Diagnosis not present

## 2019-05-27 DIAGNOSIS — I10 Essential (primary) hypertension: Secondary | ICD-10-CM | POA: Diagnosis not present

## 2019-05-27 DIAGNOSIS — R69 Illness, unspecified: Secondary | ICD-10-CM | POA: Diagnosis not present

## 2019-05-27 DIAGNOSIS — J811 Chronic pulmonary edema: Secondary | ICD-10-CM | POA: Diagnosis not present

## 2019-05-27 DIAGNOSIS — R270 Ataxia, unspecified: Secondary | ICD-10-CM | POA: Diagnosis not present

## 2019-05-27 DIAGNOSIS — R509 Fever, unspecified: Secondary | ICD-10-CM | POA: Diagnosis not present

## 2019-05-27 DIAGNOSIS — N179 Acute kidney failure, unspecified: Secondary | ICD-10-CM | POA: Diagnosis not present

## 2019-05-27 DIAGNOSIS — R42 Dizziness and giddiness: Secondary | ICD-10-CM | POA: Diagnosis not present

## 2019-05-27 DIAGNOSIS — G4700 Insomnia, unspecified: Secondary | ICD-10-CM | POA: Diagnosis not present

## 2019-05-28 DIAGNOSIS — M4856XD Collapsed vertebra, not elsewhere classified, lumbar region, subsequent encounter for fracture with routine healing: Secondary | ICD-10-CM | POA: Diagnosis not present

## 2019-05-28 DIAGNOSIS — I10 Essential (primary) hypertension: Secondary | ICD-10-CM | POA: Diagnosis not present

## 2019-05-28 DIAGNOSIS — N390 Urinary tract infection, site not specified: Secondary | ICD-10-CM | POA: Diagnosis not present

## 2019-05-28 DIAGNOSIS — D649 Anemia, unspecified: Secondary | ICD-10-CM | POA: Diagnosis not present

## 2019-05-28 DIAGNOSIS — R69 Illness, unspecified: Secondary | ICD-10-CM | POA: Diagnosis not present

## 2019-05-28 DIAGNOSIS — E854 Organ-limited amyloidosis: Secondary | ICD-10-CM | POA: Diagnosis not present

## 2019-05-28 DIAGNOSIS — G9341 Metabolic encephalopathy: Secondary | ICD-10-CM | POA: Diagnosis not present

## 2019-05-28 DIAGNOSIS — R4182 Altered mental status, unspecified: Secondary | ICD-10-CM | POA: Diagnosis not present

## 2019-05-29 DIAGNOSIS — E870 Hyperosmolality and hypernatremia: Secondary | ICD-10-CM | POA: Diagnosis not present

## 2019-05-29 DIAGNOSIS — J156 Pneumonia due to other aerobic Gram-negative bacteria: Secondary | ICD-10-CM | POA: Diagnosis not present

## 2019-05-29 DIAGNOSIS — G301 Alzheimer's disease with late onset: Secondary | ICD-10-CM | POA: Diagnosis not present

## 2019-05-31 DIAGNOSIS — D649 Anemia, unspecified: Secondary | ICD-10-CM | POA: Diagnosis not present

## 2019-05-31 DIAGNOSIS — E860 Dehydration: Secondary | ICD-10-CM | POA: Diagnosis not present

## 2021-08-16 IMAGING — CT CT HEAD W/O CM
3 of 4 series · 15 of 47 positions shown, 18 images · non-contrast
Comparison: None.

CLINICAL DATA: Altered mental status.

EXAM:
CT HEAD WITHOUT CONTRAST
TECHNIQUE: Contiguous axial images were obtained from the base of the skull
through the vertex without intravenous contrast.

[Series 4: head 2.0 h70h · axial · 0.45mm/px · z∈[-70,+68]mm · 9 of 87 slices shown, 12 images]
[im 9/87  brain]
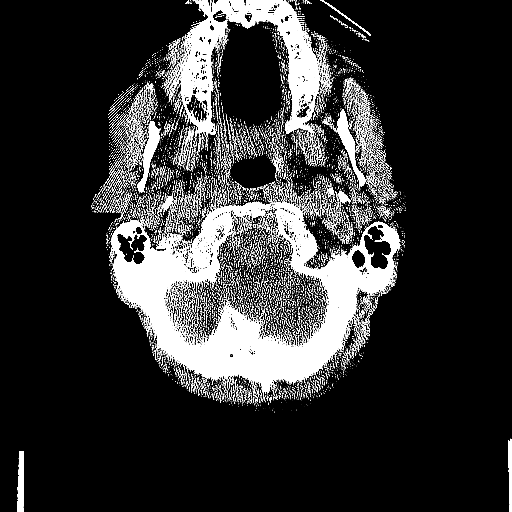
[im 9/87  bone]
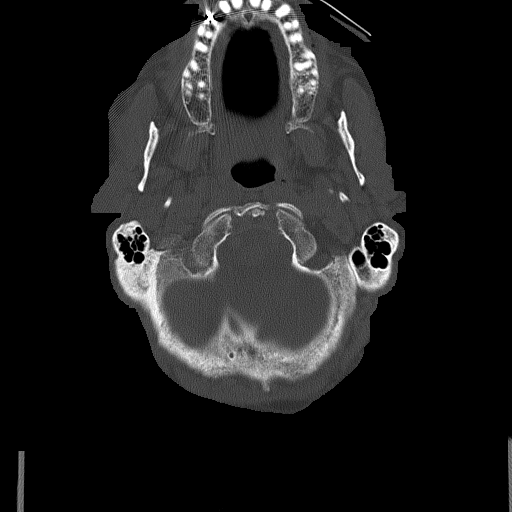
[im 18/87  brain]
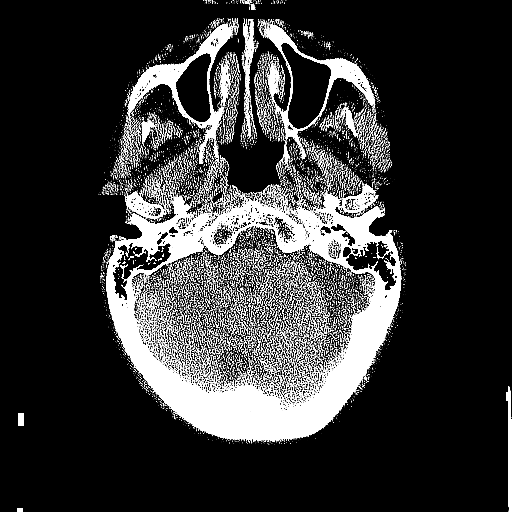
[im 26/87  brain]
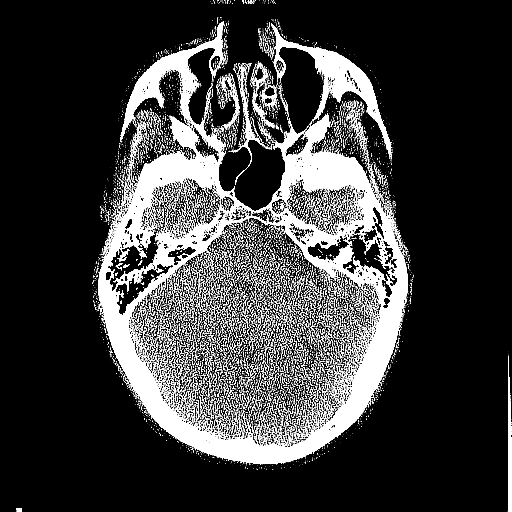
[im 35/87  brain]
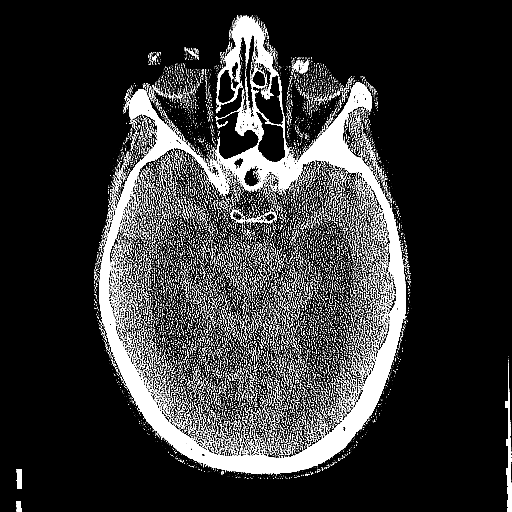
[im 44/87  brain]
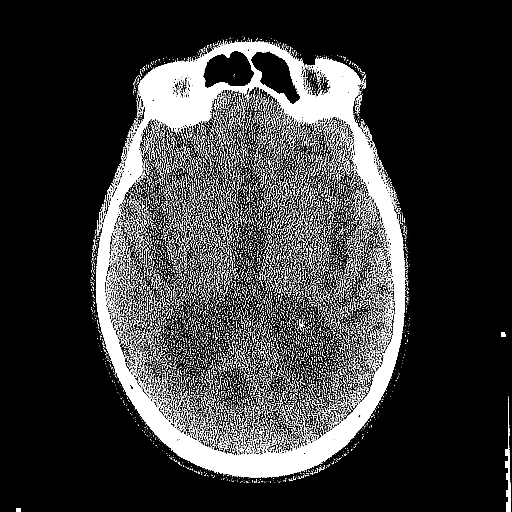
[im 44/87  bone]
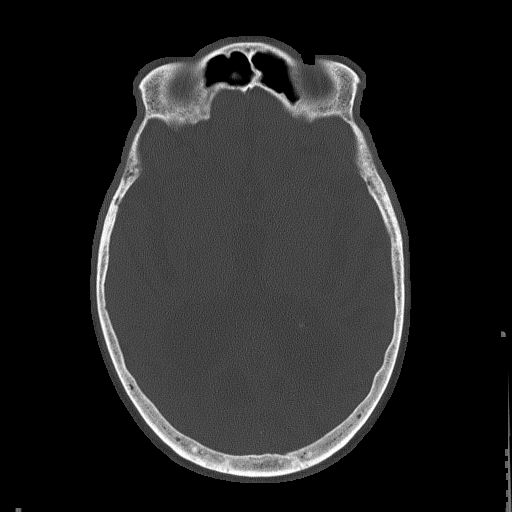
[im 52/87  brain]
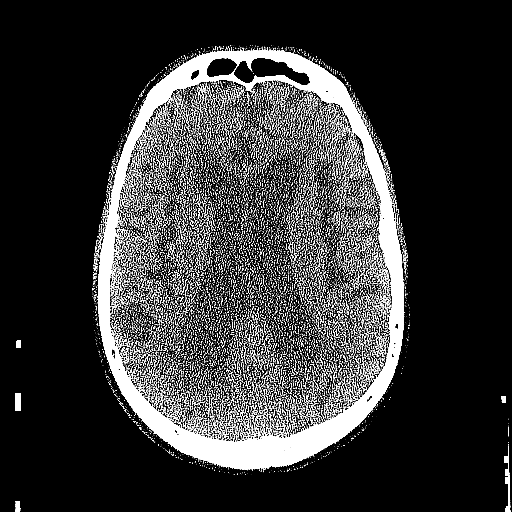
[im 61/87  brain]
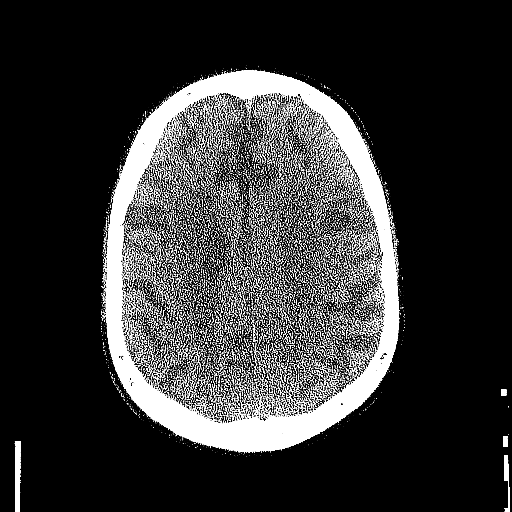
[im 69/87  brain]
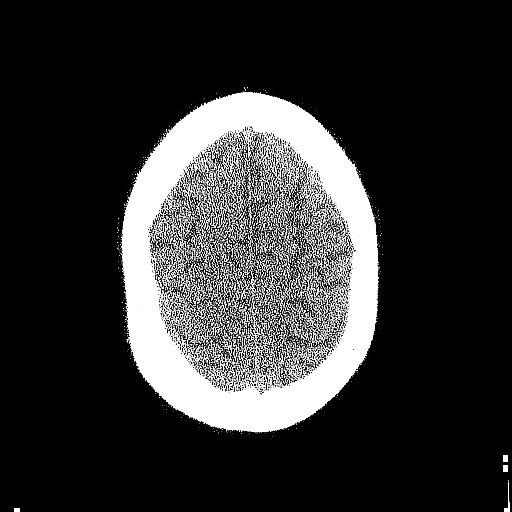
[im 78/87  brain]
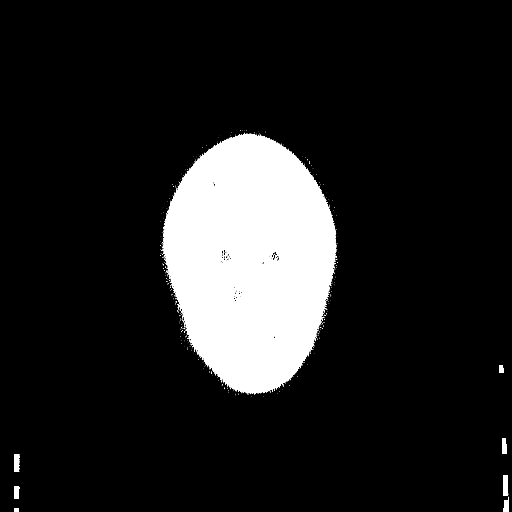
[im 78/87  bone]
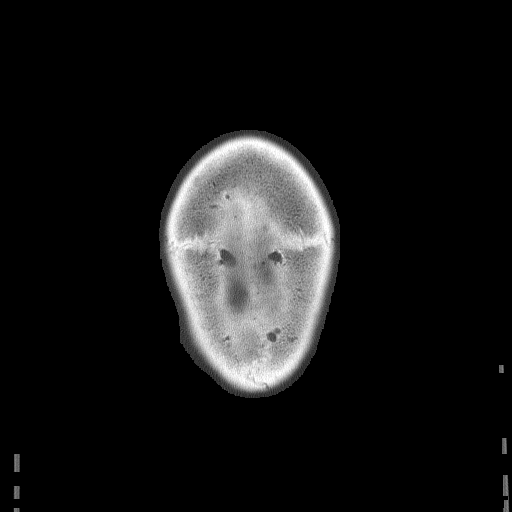

[Series 5: head 3.0 mpr cor · coronal · 0.35mm/px · 3 of 72 slices shown]
[im 24/72  brain]
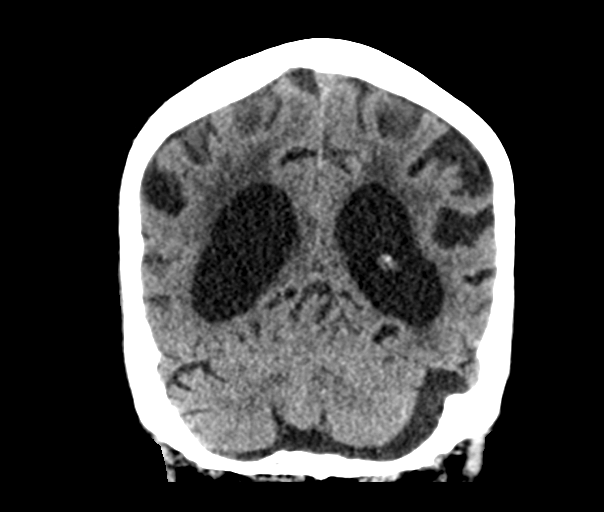
[im 32/72  brain]
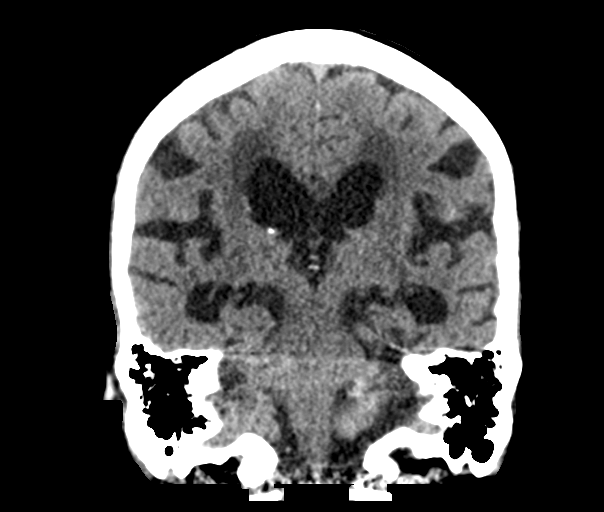
[im 40/72  brain]
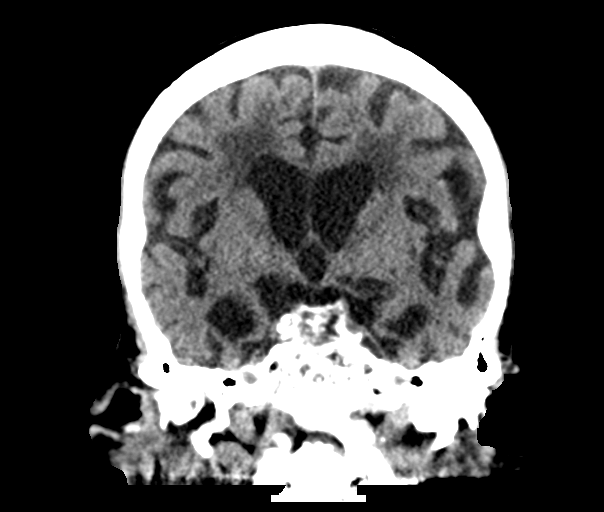

[Series 6: head 3.0 mpr sag · sagittal · 0.36mm/px · 3 of 53 slices shown]
[im 18/53  brain]
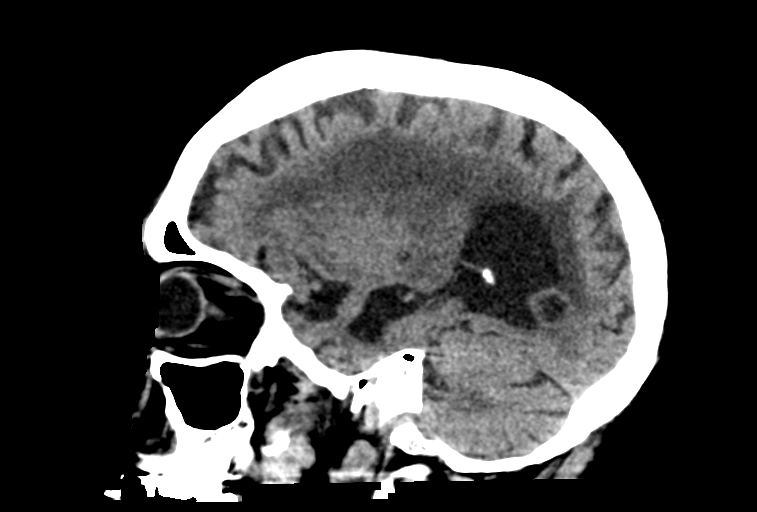
[im 27/53  brain]
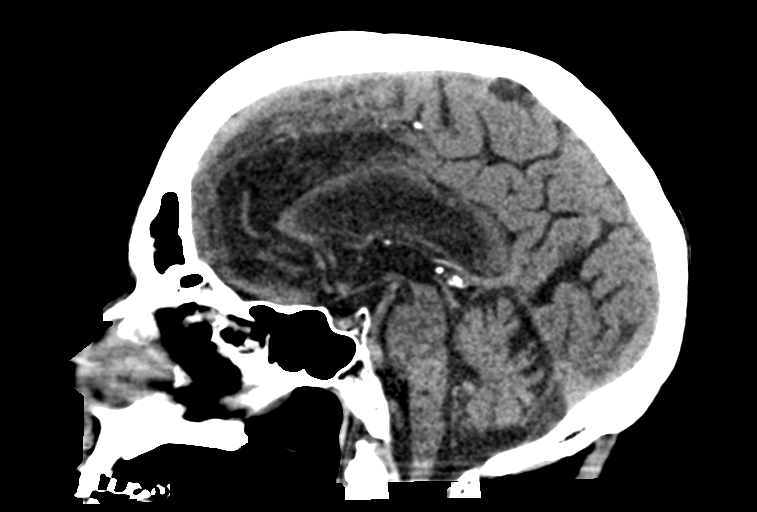
[im 35/53  brain]
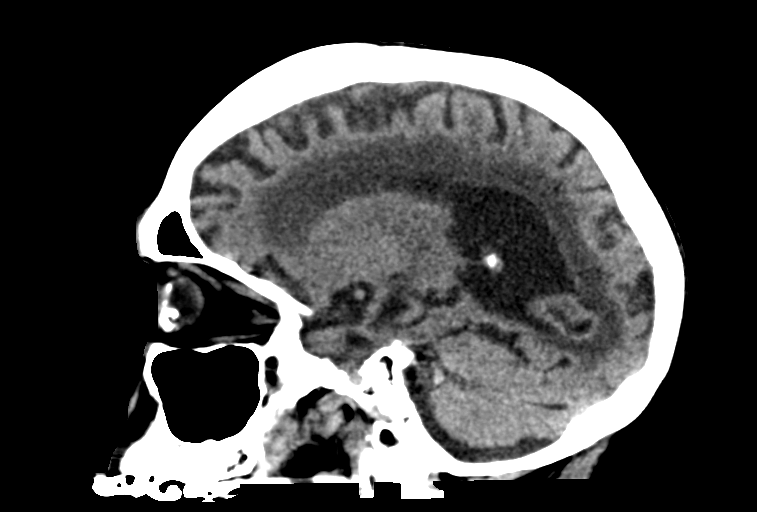

[15 of 47 positions shown; findings below may reference images not displayed]

FINDINGS: Brain: There is atrophy and chronic small vessel disease changes. No
acute intracranial abnormality. Specifically, no hemorrhage,
hydrocephalus, mass lesion, acute infarction, or significant
intracranial injury.

Vascular: No hyperdense vessel or unexpected calcification.

Skull: No acute calvarial abnormality.

Sinuses/Orbits: Visualized paranasal sinuses and mastoids clear.
Orbital soft tissues unremarkable.

Other: none
IMPRESSION: Atrophy, chronic microvascular disease.

No acute intracranial abnormality.

## 2021-08-16 IMAGING — DX DG CHEST 1V PORT
1 series · 1 of 1 positions shown · non-contrast
Comparison: None.

CLINICAL DATA: Fever for 1 day. Altered mental status.

EXAM:
PORTABLE CHEST 1 VIEW

[chest ap]
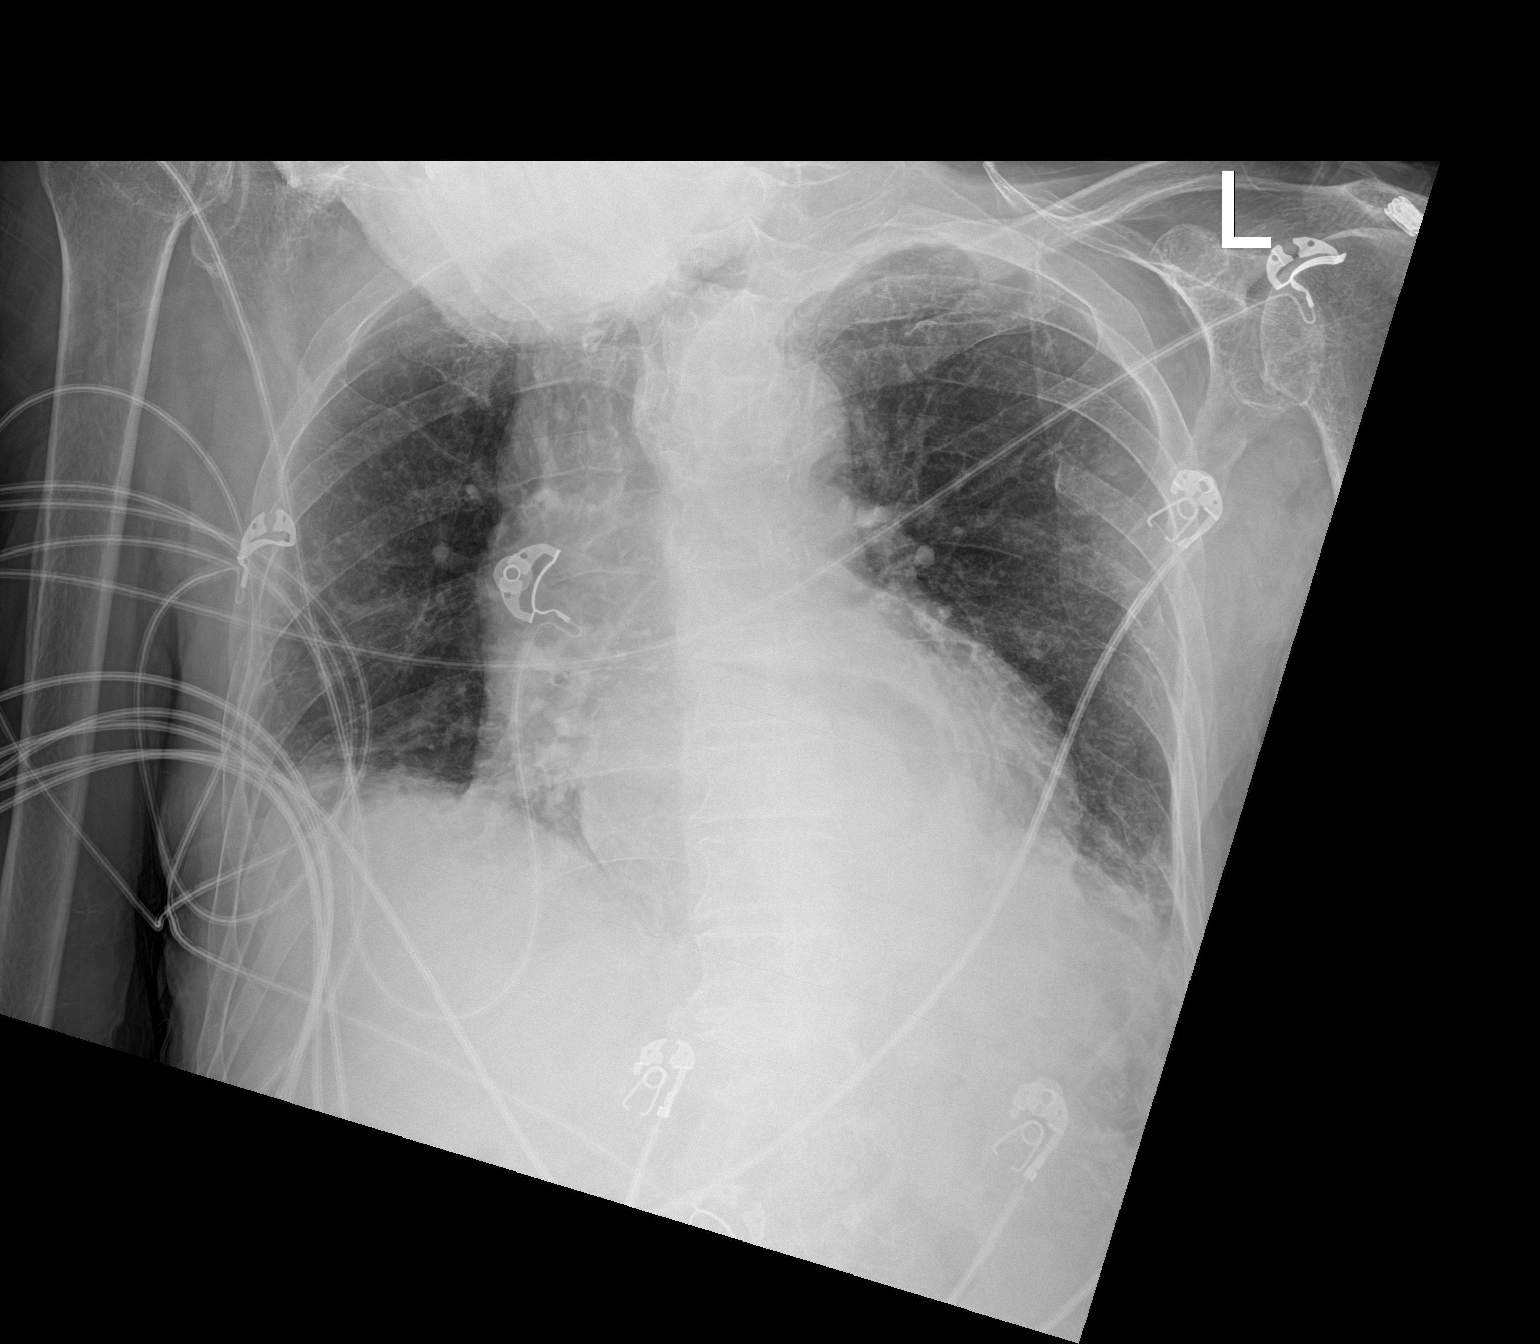

[1 of 1 positions shown; findings below may reference images not displayed]

FINDINGS: 9493 hours. The heart size is at the upper limits of normal for
portable semi erect technique. There is a moderate size hiatal
hernia and mild aortic atherosclerosis. There are low lung volumes
with probable bibasilar atelectasis. No edema, confluent airspace
opacity, pneumothorax or significant pleural effusion.

Old rib fractures are present on the left. Evidence of a chronic
right-sided rotator cuff tear. Telemetry leads overlie the chest.
IMPRESSION: 1. Low lung volumes with probable bibasilar atelectasis. No focal
airspace disease or edema.
2. Moderate-sized hiatal hernia.

## 2023-03-05 DEATH — deceased
